# Patient Record
Sex: Female | Born: 1955 | Race: White | Hispanic: No | Marital: Married | Smoking: Former smoker
Health system: Southern US, Community
[De-identification: ages and names within clinical notes are randomized; demographics above are authoritative.]

## PROBLEM LIST (undated history)

## (undated) DIAGNOSIS — E559 Vitamin D deficiency, unspecified: Secondary | ICD-10-CM

## (undated) DIAGNOSIS — G43909 Migraine, unspecified, not intractable, without status migrainosus: Secondary | ICD-10-CM

## (undated) DIAGNOSIS — E079 Disorder of thyroid, unspecified: Secondary | ICD-10-CM

## (undated) DIAGNOSIS — E785 Hyperlipidemia, unspecified: Secondary | ICD-10-CM

## (undated) DIAGNOSIS — F329 Major depressive disorder, single episode, unspecified: Secondary | ICD-10-CM

## (undated) DIAGNOSIS — K219 Gastro-esophageal reflux disease without esophagitis: Secondary | ICD-10-CM

## (undated) DIAGNOSIS — F32A Depression, unspecified: Secondary | ICD-10-CM

## (undated) HISTORY — DX: Depression, unspecified: F32.A

## (undated) HISTORY — DX: Gastro-esophageal reflux disease without esophagitis: K21.9

## (undated) HISTORY — DX: Migraine, unspecified, not intractable, without status migrainosus: G43.909

## (undated) HISTORY — DX: Vitamin D deficiency, unspecified: E55.9

## (undated) HISTORY — DX: Disorder of thyroid, unspecified: E07.9

## (undated) HISTORY — DX: Hyperlipidemia, unspecified: E78.5

## (undated) HISTORY — PX: TONSILLECTOMY: SUR1361

## (undated) HISTORY — DX: Major depressive disorder, single episode, unspecified: F32.9

## (undated) HISTORY — PX: BREAST BIOPSY: SHX20

---

## 2015-02-27 ENCOUNTER — Other Ambulatory Visit: Payer: Self-pay

## 2015-02-27 DIAGNOSIS — Z1231 Encounter for screening mammogram for malignant neoplasm of breast: Secondary | ICD-10-CM

## 2015-03-18 ENCOUNTER — Ambulatory Visit
Admission: RE | Admit: 2015-03-18 | Discharge: 2015-03-18 | Disposition: A | Discharge status: Home / Self Care | Payer: No Typology Code available for payment source | Source: Ambulatory Visit | Attending: Family Medicine | Admitting: Family Medicine

## 2015-03-18 DIAGNOSIS — Z1231 Encounter for screening mammogram for malignant neoplasm of breast: Secondary | ICD-10-CM | POA: Diagnosis not present

## 2015-03-24 ENCOUNTER — Other Ambulatory Visit: Payer: Self-pay | Admitting: Family Medicine

## 2015-03-24 DIAGNOSIS — N6489 Other specified disorders of breast: Secondary | ICD-10-CM

## 2015-03-24 DIAGNOSIS — R928 Other abnormal and inconclusive findings on diagnostic imaging of breast: Secondary | ICD-10-CM

## 2015-04-03 ENCOUNTER — Ambulatory Visit
Admission: RE | Admit: 2015-04-03 | Discharge: 2015-04-03 | Disposition: A | Discharge status: Home / Self Care | Payer: No Typology Code available for payment source | Source: Ambulatory Visit | Attending: Family Medicine | Admitting: Family Medicine

## 2015-04-03 DIAGNOSIS — R928 Other abnormal and inconclusive findings on diagnostic imaging of breast: Secondary | ICD-10-CM

## 2015-04-03 DIAGNOSIS — N6489 Other specified disorders of breast: Secondary | ICD-10-CM

## 2015-04-03 DIAGNOSIS — N63 Unspecified lump in breast: Secondary | ICD-10-CM | POA: Diagnosis present

## 2015-04-08 ENCOUNTER — Telehealth: Payer: Self-pay | Admitting: Family Medicine

## 2015-04-08 NOTE — Telephone Encounter (Signed)
Amy, will you please make sure her breast imaging orders have been done ([facility] episode of [facility] want to make sure imaging center puts in the orders and patient is contacted; thanks

## 2015-04-08 NOTE — Telephone Encounter (Signed)
-----   Message from Kerman PasseyMelinda P Lada, MD sent at 04/03/2015  5:03 PM EDT ----- Regarding: Abnormal mammo Radiology should be ordering f/u breast imaging; make sure this happened

## 2015-04-08 NOTE — Telephone Encounter (Signed)
Dr. Sherie DonLada and I reviewed her chart. Pt. Has had all additional imaging done. They are just waiting on her prior films to arrive so they can do comparison.

## 2015-06-12 ENCOUNTER — Other Ambulatory Visit: Payer: Self-pay

## 2015-06-12 DIAGNOSIS — K219 Gastro-esophageal reflux disease without esophagitis: Secondary | ICD-10-CM | POA: Insufficient documentation

## 2015-06-12 DIAGNOSIS — Z5181 Encounter for therapeutic drug level monitoring: Secondary | ICD-10-CM | POA: Insufficient documentation

## 2015-06-12 DIAGNOSIS — F330 Major depressive disorder, recurrent, mild: Secondary | ICD-10-CM | POA: Insufficient documentation

## 2015-06-12 DIAGNOSIS — E039 Hypothyroidism, unspecified: Secondary | ICD-10-CM | POA: Insufficient documentation

## 2015-06-12 DIAGNOSIS — G43909 Migraine, unspecified, not intractable, without status migrainosus: Secondary | ICD-10-CM | POA: Insufficient documentation

## 2015-06-12 DIAGNOSIS — E785 Hyperlipidemia, unspecified: Secondary | ICD-10-CM | POA: Insufficient documentation

## 2015-06-12 MED ORDER — ATORVASTATIN CALCIUM 20 MG PO TABS: 20.0000 mg | ORAL_TABLET | Freq: Every day | ORAL | Status: DC

## 2015-06-12 NOTE — Telephone Encounter (Signed)
She needs a refill on her atorvastatin.

## 2015-06-12 NOTE — Telephone Encounter (Signed)
Patient notified

## 2015-06-12 NOTE — Telephone Encounter (Signed)
Last labs from Feb reviewed; I have entered labs for fasting chol and sgpt Please ask her to have labs drawn in next 2-3 weeks Refill sent

## 2015-07-16 ENCOUNTER — Other Ambulatory Visit: Payer: Self-pay

## 2015-07-16 MED ORDER — OMEPRAZOLE 20 MG PO CPDR: 20.0000 mg | DELAYED_RELEASE_CAPSULE | Freq: Every day | ORAL | Status: DC | PRN

## 2015-07-16 NOTE — Telephone Encounter (Signed)
Pharmacy is [facility]. 

## 2015-07-22 ENCOUNTER — Telehealth: Payer: Self-pay

## 2015-07-22 NOTE — Telephone Encounter (Signed)
-----   Message from Kerman Passey, MD sent at 07/19/2015 12:11 PM EDT ----- Regarding: Remind patient to come in for fasting labs please Overdue for lipids and sgpt  ----- Message -----    From: SYSTEM    Sent: 07/18/2015  12:06 AM      To: Kerman Passey, MD

## 2015-07-22 NOTE — Telephone Encounter (Signed)
Left detailed message to come in for labs.

## 2015-08-06 ENCOUNTER — Other Ambulatory Visit: Payer: Self-pay

## 2015-08-06 DIAGNOSIS — E785 Hyperlipidemia, unspecified: Secondary | ICD-10-CM

## 2015-08-06 NOTE — Telephone Encounter (Signed)
She is coming in on Monday to get labs, but is out of her Atorvastatin.

## 2015-08-07 MED ORDER — ATORVASTATIN CALCIUM 20 MG PO TABS: 20.0000 mg | ORAL_TABLET | Freq: Every day | ORAL | Status: DC

## 2015-08-07 NOTE — Telephone Encounter (Signed)
She is still overdue for labs; I'll send one more month as she says she is going to come in

## 2015-08-11 ENCOUNTER — Other Ambulatory Visit: Payer: No Typology Code available for payment source

## 2015-08-11 DIAGNOSIS — Z5181 Encounter for therapeutic drug level monitoring: Secondary | ICD-10-CM

## 2015-08-11 DIAGNOSIS — E785 Hyperlipidemia, unspecified: Secondary | ICD-10-CM

## 2015-08-12 ENCOUNTER — Telehealth: Payer: Self-pay | Admitting: Family Medicine

## 2015-08-12 ENCOUNTER — Encounter: Payer: Self-pay | Admitting: Family Medicine

## 2015-08-12 DIAGNOSIS — E785 Hyperlipidemia, unspecified: Secondary | ICD-10-CM

## 2015-08-12 LAB — LIPID PANEL W/O CHOL/HDL RATIO
Cholesterol, Total: 171 mg/dL (ref 100–199)
HDL: 43 mg/dL (ref >39)
LDL Calculated: 99 mg/dL (ref 0–99)
TRIGLYCERIDES: 143 mg/dL (ref 0–149)
VLDL Cholesterol Cal: 29 mg/dL (ref 5–40)

## 2015-08-12 LAB — ALT: ALT: 25 IU/L (ref 0–32)

## 2015-08-12 MED ORDER — ATORVASTATIN CALCIUM 20 MG PO TABS
20.0000 mg | ORAL_TABLET | Freq: Every day | ORAL | Status: DC
Start: 2015-08-12 — End: 2016-02-26

## 2015-08-12 NOTE — Telephone Encounter (Signed)
I talked with patient about labs

## 2016-01-19 ENCOUNTER — Other Ambulatory Visit: Payer: Self-pay | Admitting: Family Medicine

## 2016-01-19 NOTE — Telephone Encounter (Signed)
Called patient and left vm to return out call and schedule a f/u appt for her thyroid and cholesterol.

## 2016-01-19 NOTE — Telephone Encounter (Signed)
Please let Revonda StandardSheree Ahn know that I'd like to see patient for an appointment here in the office for:  Thyroid, cholesterol We don't have any visits with her since we went live on [facility] June 1st Please schedule a visit with me on or after April 10th (that will be six months from last set of labs); [facility], [facility] Fasting?  yes Thank you, Dr. Sherie DonLada

## 2016-01-22 ENCOUNTER — Encounter: Payer: Self-pay | Admitting: Family Medicine

## 2016-01-22 NOTE — Telephone Encounter (Signed)
Left multiple vm, no answer. Sending letter home 01/22/16.

## 2016-02-26 ENCOUNTER — Ambulatory Visit (INDEPENDENT_AMBULATORY_CARE_PROVIDER_SITE_OTHER): Payer: No Typology Code available for payment source | Admitting: Family Medicine

## 2016-02-26 ENCOUNTER — Encounter: Payer: Self-pay | Admitting: Family Medicine

## 2016-02-26 VITALS — BP 126/82 | HR 85 | Temp 98.4°F | Resp 14 | Ht 64.0 in | Wt 173.0 lb

## 2016-02-26 DIAGNOSIS — E039 Hypothyroidism, unspecified: Secondary | ICD-10-CM

## 2016-02-26 DIAGNOSIS — Z124 Encounter for screening for malignant neoplasm of cervix: Secondary | ICD-10-CM | POA: Diagnosis not present

## 2016-02-26 DIAGNOSIS — Z1211 Encounter for screening for malignant neoplasm of colon: Secondary | ICD-10-CM

## 2016-02-26 DIAGNOSIS — E785 Hyperlipidemia, unspecified: Secondary | ICD-10-CM

## 2016-02-26 DIAGNOSIS — R12 Heartburn: Secondary | ICD-10-CM | POA: Diagnosis not present

## 2016-02-26 DIAGNOSIS — Z636 Dependent relative needing care at home: Secondary | ICD-10-CM | POA: Diagnosis not present

## 2016-02-26 DIAGNOSIS — Z0000 Encounter for general adult medical examination without abnormal findings: Secondary | ICD-10-CM | POA: Diagnosis not present

## 2016-02-26 MED ORDER — OMEPRAZOLE 20 MG PO CPDR: 20.0000 mg | DELAYED_RELEASE_CAPSULE | Freq: Every day | ORAL | Status: DC | PRN

## 2016-02-26 MED ORDER — LEVOTHYROXINE SODIUM 88 MCG PO TABS: 88.0000 ug | ORAL_TABLET | Freq: Every day | ORAL | Status: DC

## 2016-02-26 MED ORDER — ATORVASTATIN CALCIUM 20 MG PO TABS
20.0000 mg | ORAL_TABLET | Freq: Every day | ORAL | Status: DC
Start: 2016-02-26 — End: 2017-03-11

## 2016-02-26 NOTE — Patient Instructions (Addendum)
Take 1,000 iu vitamin D daily  Health Maintenance, Female Adopting a healthy lifestyle and getting preventive care can go a long way to promote health and wellness. Talk with your health care provider about what schedule of regular examinations is right for you. This is a good chance for you to check in with your provider about disease prevention and staying healthy. In between checkups, there are plenty of things you can do on your own. Experts have done a lot of research about which lifestyle changes and preventive measures are most likely to keep you healthy. Ask your health care provider for more information. WEIGHT AND DIET  Eat a healthy diet  Be sure to include plenty of vegetables, fruits, low-fat dairy products, and lean protein.  Do not eat a lot of foods high in solid fats, added sugars, or salt.  Get regular exercise. This is one of the most important things you can do for your health.  Most adults should exercise for at least 150 minutes each week. The exercise should increase your heart rate and make you sweat (moderate-intensity exercise).  Most adults should also do strengthening exercises at least twice a week. This is in addition to the moderate-intensity exercise.  Maintain a healthy weight  Body mass index (BMI) is a measurement that can be used to identify possible weight problems. It estimates body fat based on height and weight. Your health care provider can help determine your BMI and help you achieve or maintain a healthy weight.  For females 25 years of age and older:   A BMI below 18.5 is considered underweight.  A BMI of 18.5 to 24.9 is normal.  A BMI of 25 to 29.9 is considered overweight.  A BMI of 30 and above is considered obese.  Watch levels of cholesterol and blood lipids  You should start having your blood tested for lipids and cholesterol at 60 years of age, then have this test every 5 years.  You may need to have your cholesterol levels  checked more often if:  Your lipid or cholesterol levels are high.  You are older than 60 years of age.  You are at high risk for heart disease.  CANCER SCREENING   Lung Cancer  Lung cancer screening is recommended for adults 30-2 years old who are at high risk for lung cancer because of a history of smoking.  A yearly low-dose CT scan of the lungs is recommended for people who:  Currently smoke.  Have quit within the past 15 years.  Have at least a 30-pack-year history of smoking. A pack year is smoking an average of one pack of cigarettes a day for 1 year.  Yearly screening should continue until it has been 15 years since you quit.  Yearly screening should stop if you develop a health problem that would prevent you from having lung cancer treatment.  Breast Cancer  Practice breast self-awareness. This means understanding how your breasts normally appear and feel.  It also means doing regular breast self-exams. Let your health care provider know about any changes, no matter how small.  If you are in your 20s or 30s, you should have a clinical breast exam (CBE) by a health care provider every 1-3 years as part of a regular health exam.  If you are 32 or older, have a CBE every year. Also consider having a breast X-ray (mammogram) every year.  If you have a family history of breast cancer, talk to your health care  provider about genetic screening.  If you are at high risk for breast cancer, talk to your health care provider about having an MRI and a mammogram every year.  Breast cancer gene (BRCA) assessment is recommended for women who have family members with BRCA-related cancers. BRCA-related cancers include:  Breast.  Ovarian.  Tubal.  Peritoneal cancers.  Results of the assessment will determine the need for genetic counseling and BRCA1 and BRCA2 testing. Cervical Cancer Your health care provider may recommend that you be screened regularly for cancer of the  pelvic organs (ovaries, uterus, and vagina). This screening involves a pelvic examination, including checking for microscopic changes to the surface of your cervix (Pap test). You may be encouraged to have this screening done every 3 years, beginning at age 17.  For women ages 42-65, health care providers may recommend pelvic exams and Pap testing every 3 years, or they may recommend the Pap and pelvic exam, combined with testing for human papilloma virus (HPV), every 5 years. Some types of HPV increase your risk of cervical cancer. Testing for HPV may also be done on women of any age with unclear Pap test results.  Other health care providers may not recommend any screening for nonpregnant women who are considered low risk for pelvic cancer and who do not have symptoms. Ask your health care provider if a screening pelvic exam is right for you.  If you have had past treatment for cervical cancer or a condition that could lead to cancer, you need Pap tests and screening for cancer for at least 20 years after your treatment. If Pap tests have been discontinued, your risk factors (such as having a new sexual partner) need to be reassessed to determine if screening should resume. Some women have medical problems that increase the chance of getting cervical cancer. In these cases, your health care provider may recommend more frequent screening and Pap tests. Colorectal Cancer  This type of cancer can be detected and often prevented.  Routine colorectal cancer screening usually begins at 60 years of age and continues through 60 years of age.  Your health care provider may recommend screening at an earlier age if you have risk factors for colon cancer.  Your health care provider may also recommend using home test kits to check for hidden blood in the stool.  A small camera at the end of a tube can be used to examine your colon directly (sigmoidoscopy or colonoscopy). This is done to check for the earliest  forms of colorectal cancer.  Routine screening usually begins at age 10.  Direct examination of the colon should be repeated every 5-10 years through 60 years of age. However, you may need to be screened more often if early forms of precancerous polyps or small growths are found. Skin Cancer  Check your skin from head to toe regularly.  Tell your health care provider about any new moles or changes in moles, especially if there is a change in a mole's shape or color.  Also tell your health care provider if you have a mole that is larger than the size of a pencil eraser.  Always use sunscreen. Apply sunscreen liberally and repeatedly throughout the day.  Protect yourself by wearing long sleeves, pants, a wide-brimmed hat, and sunglasses whenever you are outside. HEART DISEASE, DIABETES, AND HIGH BLOOD PRESSURE   High blood pressure causes heart disease and increases the risk of stroke. High blood pressure is more likely to develop in:  People who  have blood pressure in the high end of the normal range (130-139/85-89 mm Hg).  People who are overweight or obese.  People who are African American.  If you are 21-15 years of age, have your blood pressure checked every 3-5 years. If you are 19 years of age or older, have your blood pressure checked every year. You should have your blood pressure measured twice--once when you are at a hospital or clinic, and once when you are not at a hospital or clinic. Record the average of the two measurements. To check your blood pressure when you are not at a hospital or clinic, you can use:  An automated blood pressure machine at a pharmacy.  A home blood pressure monitor.  If you are between 51 years and 61 years old, ask your health care provider if you should take aspirin to prevent strokes.  Have regular diabetes screenings. This involves taking a blood sample to check your fasting blood sugar level.  If you are at a normal weight and have a low  risk for diabetes, have this test once every three years after 60 years of age.  If you are overweight and have a high risk for diabetes, consider being tested at a younger age or more often. PREVENTING INFECTION  Hepatitis B  If you have a higher risk for hepatitis B, you should be screened for this virus. You are considered at high risk for hepatitis B if:  You were born in a country where hepatitis B is common. Ask your health care provider which countries are considered high risk.  Your parents were born in a high-risk country, and you have not been immunized against hepatitis B (hepatitis B vaccine).  You have HIV or AIDS.  You use needles to inject street drugs.  You live with someone who has hepatitis B.  You have had sex with someone who has hepatitis B.  You get hemodialysis treatment.  You take certain medicines for conditions, including cancer, organ transplantation, and autoimmune conditions. Hepatitis C  Blood testing is recommended for:  Everyone born from [birth year] through 1965.  Anyone with known risk factors for hepatitis C. Sexually transmitted infections (STIs)  You should be screened for sexually transmitted infections (STIs) including gonorrhea and chlamydia if:  You are sexually active and are younger than 60 years of age.  You are older than 60 years of age and your health care provider tells you that you are at risk for this type of infection.  Your sexual activity has changed since you were last screened and you are at an increased risk for chlamydia or gonorrhea. Ask your health care provider if you are at risk.  If you do not have HIV, but are at risk, it may be recommended that you take a prescription medicine daily to prevent HIV infection. This is called pre-exposure prophylaxis (PrEP). You are considered at risk if:  You are sexually active and do not regularly use condoms or know the HIV status of your partner(s).  You take drugs by  injection.  You are sexually active with a partner who has HIV. Talk with your health care provider about whether you are at high risk of being infected with HIV. If you choose to begin PrEP, you should first be tested for HIV. You should then be tested every 3 months for as long as you are taking PrEP.  PREGNANCY   If you are premenopausal and you may become pregnant, ask your health care provider  about preconception counseling.  If you may become pregnant, take 400 to 800 micrograms (mcg) of folic acid every day.  If you want to prevent pregnancy, talk to your health care provider about birth control (contraception). OSTEOPOROSIS AND MENOPAUSE   Osteoporosis is a disease in which the bones lose minerals and strength with aging. This can result in serious bone fractures. Your risk for osteoporosis can be identified using a bone density scan.  If you are 44 years of age or older, or if you are at risk for osteoporosis and fractures, ask your health care provider if you should be screened.  Ask your health care provider whether you should take a calcium or vitamin D supplement to lower your risk for osteoporosis.  Menopause may have certain physical symptoms and risks.  Hormone replacement therapy may reduce some of these symptoms and risks. Talk to your health care provider about whether hormone replacement therapy is right for you.  HOME CARE INSTRUCTIONS   Schedule regular health, dental, and eye exams.  Stay current with your immunizations.   Do not use any tobacco products including cigarettes, chewing tobacco, or electronic cigarettes.  If you are pregnant, do not drink alcohol.  If you are breastfeeding, limit how much and how often you drink alcohol.  Limit alcohol intake to no more than 1 drink per day for nonpregnant women. One drink equals 12 ounces of beer, 5 ounces of wine, or 1 ounces of hard liquor.  Do not use street drugs.  Do not share needles.  Ask your  health care provider for help if you need support or information about quitting drugs.  Tell your health care provider if you often feel depressed.  Tell your health care provider if you have ever been abused or do not feel safe at home.   This information is not intended to replace advice given to you by your health care provider. Make sure you discuss any questions you have with your health care provider.   Document Released: 05/03/2011 Document Revised: 11/08/2014 Document Reviewed: 09/19/2013 Elsevier Interactive Patient Education [employer].

## 2016-02-26 NOTE — Assessment & Plan Note (Signed)
Check lipids 

## 2016-02-26 NOTE — Assessment & Plan Note (Signed)
Check TSH 

## 2016-02-26 NOTE — Progress Notes (Signed)
Patient ID: Dawn Wells, female   DOB: 1956/09/14, 60 y.o.   MRN: [id number]   Subjective:   Dawn Wells is a 60 y.o. 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 Does not feel hopeless; does not feel like hurting herself or anyone else Not sleeping well for 36 years; has too much responsibility in the middle of the night, does not want a sleeping pill Mother-in-law left for [place] in Christmas Husband is dealing with dementia; she does not want a support group or medicine or counseling; she can vent at work  USPSTF grade A and B recommendations Alcohol: n/a Depression: would not do the testing Hypertension: controlled Obesity: not obese Tobacco use: former use; quit at age 18 HIV, hep B, hep C: declined STD testing and prevention (chl/gon/syphilis): asx Lipids: fasting labs another day Glucose: fasting labs another day Colorectal cancer: 2007; patient refuses colonoscopy; willing to do Cologuard Breast cancer: done last year BRCA gene screening: no breast or ovarian cancer Intimate partner violence: no Cervical cancer screening: negative last year Lung cancer: n/a Osteoporosis: start at age 53 Fall prevention/vitamin D: taking vit D 1000 iu BID AAA: n/a Aspirin: taking excedrin daily with 250 mg aspirin Diet: eats pretty well; doesn't eat burgers much; cereal in the morning Exercise: tries to walk every day, not miles, but is active, 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 Thyroid disease    . Hyperlipidemia   . Vitamin D deficiency   . Depression   . Migraine   . GERD (gastroesophageal reflux disease)     Past Surgical History  Procedure Laterality Date  . Cesarean section      X2  . Tonsillectomy     Family History  Problem Relation Age of Onset  . Leukemia Mother   . Hyperlipidemia Mother   . Hypertension Mother   . Heart disease Father   . Hyperlipidemia Father   . Hypertension Father   . Kidney disease Father   . Diabetes Sister   . Heart disease Sister   . Hyperlipidemia Sister   . Hypertension Sister    Social History  Substance Use Topics  . Smoking status: Former Smoker    Quit date: 02/25/1974  . Smokeless tobacco: Never Used  . Alcohol Use: No   Review of Systems  Objective:   Filed Vitals:   02/26/16 1402  BP: 126/82  Pulse: 85  Temp: 98.4 F (36.9 C)  TempSrc: Oral  Resp: 14  Height: '5\' 4"'  (1.626 m)  Weight: 173 lb (78.472 kg)  SpO2: 94%   Body mass index is 29.68 kg/(m^2). Wt Readings from Last 3 Encounters:  02/26/16 173 lb (78.472 kg)   Physical Exam  Constitutional: She appears well-developed and well-nourished.  overweight  HENT:  Head: Normocephalic and atraumatic.  Eyes: Conjunctivae and EOM are normal. Right eye exhibits no hordeolum. Left eye exhibits no hordeolum. No scleral icterus.  Neck: Carotid bruit is not present. No thyromegaly present.  Cardiovascular: Normal rate, regular rhythm, S1  normal, S2 normal and normal heart sounds.   No extrasystoles are present.  Pulmonary/Chest: Effort normal and breath sounds normal. No respiratory distress. Right breast exhibits no inverted nipple, no mass, no nipple discharge, no skin change and no tenderness. Left breast exhibits no inverted nipple, no mass, no nipple discharge, no skin change and no tenderness. Breasts are symmetrical.  Abdominal: Soft. Normal appearance and bowel sounds are normal. She exhibits no distension, no abdominal bruit, no pulsatile midline mass and  no mass. There is no hepatosplenomegaly. There is no tenderness. No hernia.  Genitourinary: Uterus normal. Pelvic exam was performed with patient prone. There is no rash or lesion on the right labia. There is no rash or lesion on the left labia. Cervix exhibits no motion tenderness. Right adnexum displays no mass, no tenderness and no fullness. Left adnexum displays no mass, no tenderness and no fullness.  Musculoskeletal: Normal range of motion. She exhibits no edema.  Lymphadenopathy:       Head (right side): No submandibular adenopathy present.       Head (left side): No submandibular adenopathy present.    She has no cervical adenopathy.    She has no axillary adenopathy.  Neurological: She is alert. She displays no tremor. No cranial nerve deficit. She exhibits normal muscle tone. Gait normal.  Skin: Skin is warm and dry. No bruising and no ecchymosis noted. No cyanosis. No pallor.  Psychiatric: Her speech is normal and behavior is normal. Thought content normal. Her mood appears not anxious. She does not exhibit a depressed mood.   Assessment/Plan:   Problem List Items Addressed This Visit      Endocrine   Hypothyroidism    Check TSH      Relevant Medications   levothyroxine (SYNTHROID, LEVOTHROID) 88 MCG tablet   Other Relevant Orders   TSH (Completed)     Other   Caregiver stress    Supportive listening provided; counseling suggested      Cervical cancer screening    Thin prep collected today      Dyslipidemia    Check lipids      Relevant Medications   atorvastatin (LIPITOR) 20 MG tablet   Preventative health care - Primary    USPSTF grade A and B recommendations reviewed with patient; age-appropriate recommendations, preventive care, screening tests, etc discussed and encouraged; healthy living encouraged; see AVS for patient education given to patient      Relevant Orders   CBC with Differential (Completed)   Comprehensive metabolic panel (Completed)   Lipid  Panel w/o Chol/HDL Ratio (Completed)    Other Visit Diagnoses    Colon cancer screening        patient declines colonoscopy but is open to Cologuard; ordered    Relevant Orders    Cologuard    Heartburn        Relevant Orders    H Pylori, IGM, IGG, IGA AB        Meds ordered this encounter  Medications  . levothyroxine (SYNTHROID, LEVOTHROID) 88 MCG tablet    Sig: Take 1 tablet (88 mcg total) by mouth daily.    Dispense:  30 tablet    Refill:  11  . atorvastatin (LIPITOR) 20 MG tablet    Sig: Take 1 tablet (20 mg total) by mouth at bedtime.    Dispense:  30 tablet    Refill:  6  . omeprazole (PRILOSEC) 20 MG capsule    Sig: Take 1 capsule (20 mg total) by  mouth daily as needed. Caution:prolonged use may increase risk of pneumonia, colitis, osteoporosis, anemia    Dispense:  30 capsule    Refill:  2   Orders Placed This Encounter  Procedures  . Cologuard  . CBC with Differential  . Comprehensive metabolic panel  . Lipid Panel w/o Chol/HDL Ratio  . TSH  . H Pylori, IGM, IGG, IGA AB  . H Pylori, IGM, IGG, IGA AB    Follow up plan: Return in about 1 year (around 02/25/2017) for complete physical. An after-visit summary was printed and given to the patient at [facility].  Please see the patient instructions which may contain other information and recommendations beyond what is mentioned above in the assessment and plan.

## 2016-03-04 ENCOUNTER — Encounter: Payer: Self-pay | Admitting: Family Medicine

## 2016-03-10 ENCOUNTER — Telehealth: Payer: Self-pay

## 2016-03-10 MED ORDER — FLUOXETINE HCL 20 MG PO TABS: 20.0000 mg | ORAL_TABLET | Freq: Every day | ORAL | Status: DC

## 2016-03-10 NOTE — Telephone Encounter (Signed)
That will be fine; Rx sent

## 2016-03-10 NOTE — Telephone Encounter (Signed)
Pt decided wanted to go back on fluoxetine 20mg  1 qd?

## 2016-03-11 LAB — CBC WITH DIFFERENTIAL/PLATELET
Basophils Absolute: 0 10*3/uL (ref 0.0–0.2)
Basos: 0 %
EOS (ABSOLUTE): 0.1 10*3/uL (ref 0.0–0.4)
EOS: 2 %
HEMATOCRIT: 38.2 % (ref 34.0–46.6)
Hemoglobin: 13.3 g/dL (ref 11.1–15.9)
Immature Grans (Abs): 0 10*3/uL (ref 0.0–0.1)
Immature Granulocytes: 0 %
LYMPHS ABS: 2.2 10*3/uL (ref 0.7–3.1)
Lymphs: 36 %
MCH: 31.4 pg (ref 26.6–33.0)
MCHC: 34.8 g/dL (ref 31.5–35.7)
MCV: 90 fL (ref 79–97)
MONOS ABS: 0.5 10*3/uL (ref 0.1–0.9)
Monocytes: 8 %
Neutrophils Absolute: 3.3 10*3/uL (ref 1.4–7.0)
Neutrophils: 54 %
Platelets: 296 10*3/uL (ref 150–379)
RBC: 4.24 x10E6/uL (ref 3.77–5.28)
RDW: 13.3 % (ref 12.3–15.4)
WBC: 6.1 10*3/uL (ref 3.4–10.8)

## 2016-03-11 LAB — COMPREHENSIVE METABOLIC PANEL
A/G RATIO: 1.8 (ref 1.2–2.2)
ALK PHOS: 87 IU/L (ref 39–117)
ALT: 27 IU/L (ref 0–32)
AST: 21 IU/L (ref 0–40)
Albumin: 4.4 g/dL (ref 3.5–5.5)
BILIRUBIN TOTAL: 0.6 mg/dL (ref 0.0–1.2)
BUN/Creatinine Ratio: 19 (ref 9–23)
BUN: 17 mg/dL (ref 6–24)
CHLORIDE: 100 mmol/L (ref 96–106)
CO2: 24 mmol/L (ref 18–29)
CREATININE: 0.91 mg/dL (ref 0.57–1.00)
Calcium: 9.8 mg/dL (ref 8.7–10.2)
GFR calc Af Amer: 80 mL/min/{1.73_m2} (ref >59)
GFR calc non Af Amer: 69 mL/min/{1.73_m2} (ref >59)
GLOBULIN, TOTAL: 2.5 g/dL (ref 1.5–4.5)
Glucose: 98 mg/dL (ref 65–99)
POTASSIUM: 4.7 mmol/L (ref 3.5–5.2)
SODIUM: 140 mmol/L (ref 134–144)
Total Protein: 6.9 g/dL (ref 6.0–8.5)

## 2016-03-11 LAB — LIPID PANEL W/O CHOL/HDL RATIO
CHOLESTEROL TOTAL: 178 mg/dL (ref 100–199)
HDL: 46 mg/dL (ref >39)
LDL Calculated: 95 mg/dL (ref 0–99)
TRIGLYCERIDES: 184 mg/dL — AB (ref 0–149)
VLDL Cholesterol Cal: 37 mg/dL (ref 5–40)

## 2016-03-11 LAB — H PYLORI, IGM, IGG, IGA AB
H pylori, IgM Abs: <9 units (ref 0.0–8.9)
H. pylori, IgA Abs: <9 units (ref 0.0–8.9)

## 2016-03-11 LAB — TSH: TSH: 2.79 u[IU]/mL (ref 0.450–4.500)

## 2016-03-21 ENCOUNTER — Encounter: Payer: Self-pay | Admitting: Family Medicine

## 2016-03-21 DIAGNOSIS — Z636 Dependent relative needing care at home: Secondary | ICD-10-CM | POA: Insufficient documentation

## 2016-03-21 NOTE — Assessment & Plan Note (Signed)
Thin prep collected today 

## 2016-03-21 NOTE — Assessment & Plan Note (Signed)
Supportive listening provided; counseling suggested

## 2016-03-21 NOTE — Assessment & Plan Note (Signed)
USPSTF grade A and B recommendations reviewed with patient; age-appropriate recommendations, preventive care, screening tests, etc discussed and encouraged; healthy living encouraged; see AVS for patient education given to patient  

## 2016-05-14 ENCOUNTER — Other Ambulatory Visit: Payer: Self-pay | Admitting: Family Medicine

## 2016-10-05 ENCOUNTER — Other Ambulatory Visit: Payer: Self-pay | Admitting: Family Medicine

## 2017-02-15 ENCOUNTER — Other Ambulatory Visit: Payer: Self-pay | Admitting: Family Medicine

## 2017-02-15 NOTE — Telephone Encounter (Signed)
Last visit April 2017; visit scheduled for later this month; one month Rx sent

## 2017-02-28 ENCOUNTER — Ambulatory Visit (INDEPENDENT_AMBULATORY_CARE_PROVIDER_SITE_OTHER): Payer: No Typology Code available for payment source | Admitting: Family Medicine

## 2017-02-28 ENCOUNTER — Encounter: Payer: Self-pay | Admitting: Family Medicine

## 2017-02-28 VITALS — BP 124/80 | HR 76 | Temp 98.4°F | Resp 16 | Ht 63.7 in | Wt 169.8 lb

## 2017-02-28 DIAGNOSIS — E039 Hypothyroidism, unspecified: Secondary | ICD-10-CM | POA: Diagnosis not present

## 2017-02-28 DIAGNOSIS — Z636 Dependent relative needing care at home: Secondary | ICD-10-CM | POA: Diagnosis not present

## 2017-02-28 DIAGNOSIS — G43709 Chronic migraine without aura, not intractable, without status migrainosus: Secondary | ICD-10-CM

## 2017-02-28 DIAGNOSIS — E785 Hyperlipidemia, unspecified: Secondary | ICD-10-CM

## 2017-02-28 DIAGNOSIS — R239 Unspecified skin changes: Secondary | ICD-10-CM

## 2017-02-28 DIAGNOSIS — K219 Gastro-esophageal reflux disease without esophagitis: Secondary | ICD-10-CM | POA: Diagnosis not present

## 2017-02-28 DIAGNOSIS — Z5181 Encounter for therapeutic drug level monitoring: Secondary | ICD-10-CM | POA: Diagnosis not present

## 2017-02-28 MED ORDER — OMEPRAZOLE 20 MG PO CPDR: 20.0000 mg | DELAYED_RELEASE_CAPSULE | Freq: Every day | ORAL | 3 refills | Status: DC

## 2017-02-28 NOTE — Assessment & Plan Note (Signed)
Switch from H2 blocker to PPI; avoid triggers, elevate HOB, refer to GI; discussed risk of barretts, esophageal cancer, need for EGD (referral entered)

## 2017-02-28 NOTE — Assessment & Plan Note (Signed)
Suggested [facility], see AVS; continue SSRI

## 2017-02-28 NOTE — Patient Instructions (Addendum)
Limit egg yolks to no more than 3 per week Try magnesium oxide 250 mg daily for headache prevention Let's have you return tomorrow or later this week for fasting labs  [facility] Address: [address] [place], [place], [place] [address]  Phone: [phone]  [email or web address]

## 2017-02-28 NOTE — Assessment & Plan Note (Signed)
Check lipids; limit saturated fats 

## 2017-02-28 NOTE — Progress Notes (Signed)
  Patient ID: Dawn Wells, female   DOB: [birth year], 61 y.o.   MRN: [id number]   Subjective:   Dawn Wells is a 61 y.o. female here for a complete physical exam  Interim issues since last visit:   Past Medical History:  Diagnosis Date  . Depression   . GERD (gastroesophageal reflux disease)   . Hyperlipidemia   . Migraine   . Thyroid disease   . Vitamin D deficiency    Past Surgical History:  Procedure Laterality Date  . CESAREAN SECTION     X2  . TONSILLECTOMY     Family History  Problem Relation Age of Onset  . Leukemia Mother   . Hyperlipidemia Mother   . Hypertension Mother   . Heart disease Father   . Hyperlipidemia Father   . Hypertension Father   . Kidney disease Father   . Diabetes Sister   . Heart disease Sister   . Hyperlipidemia Sister   . Hypertension Sister    Social History  Substance Use Topics  . Smoking status: Former Smoker    Quit date: 02/25/1974  . Smokeless tobacco: Never Used  . Alcohol use No   Review of Systems  Objective:   Vitals:   02/28/17 1111  BP: 124/80  Pulse: 76  Resp: 16  Temp: 98.4 F (36.9 C)  TempSrc: Oral  SpO2: 95%  Weight: 169 lb 12.8 oz (77 kg)  Height: 5' 3.7" (1.618 m)   Body mass index is 29.42 kg/m. Wt Readings from Last 3 Encounters:  02/28/17 169 lb 12.8 oz (77 kg)  02/26/16 173 lb (78.5 kg)   Physical Exam  Assessment/Plan:   Problem List Items Addressed This Visit    None       Meds ordered this encounter  Medications  . aspirin 325 MG tablet    Sig: Take 325 mg by mouth 2 (two) times daily.  . ranitidine (ZANTAC) 150 MG capsule    Sig: Take 75 mg by mouth 2 (two) times daily.   No orders of the defined types were placed in this encounter.   Follow up plan: No Follow-up on file.  An After Visit Summary was printed and given to the patient.

## 2017-02-28 NOTE — Assessment & Plan Note (Signed)
Check labs 

## 2017-02-28 NOTE — Assessment & Plan Note (Signed)
Check TSH and adjust dose if needed 
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
## 2017-02-28 NOTE — Assessment & Plan Note (Signed)
Evaluated already by neurologist; patient controls headaches; encouraged hydration, suggested magnesium oxide 250 mg daily

## 2017-03-02 LAB — CBC WITH DIFFERENTIAL/PLATELET
BASOS ABS: 0 {cells}/uL (ref 0–200)
Basophils Relative: 0 %
EOS ABS: 124 {cells}/uL (ref 15–500)
EOS PCT: 2 %
HCT: 40.5 % (ref 35.0–45.0)
Hemoglobin: 13.5 g/dL (ref 11.7–15.5)
LYMPHS PCT: 34 %
Lymphs Abs: 2108 cells/uL (ref 850–3900)
MCH: 31.1 pg (ref 27.0–33.0)
MCHC: 33.3 g/dL (ref 32.0–36.0)
MCV: 93.3 fL (ref 80.0–100.0)
MONOS PCT: 8 %
MPV: 10.2 fL (ref 7.5–12.5)
Monocytes Absolute: 496 cells/uL (ref 200–950)
NEUTROS PCT: 56 %
Neutro Abs: 3472 cells/uL (ref 1500–7800)
PLATELETS: 289 10*3/uL (ref 140–400)
RBC: 4.34 MIL/uL (ref 3.80–5.10)
RDW: 13.6 % (ref 11.0–15.0)
WBC: 6.2 10*3/uL (ref 3.8–10.8)

## 2017-03-02 LAB — CMP 10231
AG RATIO: 1.4 ratio (ref 1.0–2.5)
ALBUMIN: 4.1 g/dL (ref 3.6–5.1)
ALK PHOS: 84 U/L (ref 33–130)
ALT: 18 U/L (ref 6–29)
AST: 17 U/L (ref 10–35)
BUN/Creatinine Ratio: 17.9 Ratio (ref 6–22)
BUN: 15 mg/dL (ref 7–25)
CHLORIDE: 105 mmol/L (ref 98–110)
CO2: 24 mmol/L (ref 20–31)
Calcium: 9.1 mg/dL (ref 8.6–10.4)
Creat: 0.84 mg/dL (ref 0.50–0.99)
GFR, EST NON AFRICAN AMERICAN: 76 mL/min (ref >=60)
GFR, Est African American: 87 mL/min (ref >=60)
Globulin: 2.9 g/dL (ref 1.9–3.7)
Glucose, Bld: 88 mg/dL (ref 65–99)
POTASSIUM: 4.4 mmol/L (ref 3.5–5.3)
SODIUM: 141 mmol/L (ref 135–146)
TOTAL PROTEIN: 7 g/dL (ref 6.1–8.1)
Total Bilirubin: 0.5 mg/dL (ref 0.2–1.2)

## 2017-03-02 LAB — LIPID PANEL
CHOL/HDL RATIO: 4.2 ratio (ref <5.0)
CHOLESTEROL: 173 mg/dL (ref <200)
HDL: 41 mg/dL — ABNORMAL LOW (ref >50)
LDL Cholesterol: 98 mg/dL (ref <100)
Triglycerides: 172 mg/dL — ABNORMAL HIGH (ref <150)
VLDL: 34 mg/dL — ABNORMAL HIGH (ref <30)

## 2017-03-02 LAB — TSH: TSH: 2.14 mIU/L

## 2017-03-03 LAB — HEMOGLOBIN A1C
Hgb A1c MFr Bld: 5.5 % (ref <5.7)
Mean Plasma Glucose: 111 mg/dL

## 2017-03-11 ENCOUNTER — Other Ambulatory Visit: Payer: Self-pay | Admitting: Family Medicine

## 2017-03-11 DIAGNOSIS — E785 Hyperlipidemia, unspecified: Secondary | ICD-10-CM

## 2017-03-11 NOTE — Telephone Encounter (Signed)
April 2018 SGPT and lipids reviewed; Rx approved

## 2017-03-22 ENCOUNTER — Other Ambulatory Visit: Payer: Self-pay | Admitting: Family Medicine

## 2017-03-22 DIAGNOSIS — E039 Hypothyroidism, unspecified: Secondary | ICD-10-CM

## 2017-03-22 NOTE — Telephone Encounter (Signed)
Last thyroid test reviewed Lab Results  Component Value Date   TSH 2.14 02/28/2017    Rx approved

## 2017-03-27 ENCOUNTER — Other Ambulatory Visit: Payer: Self-pay | Admitting: Family Medicine

## 2017-04-01 ENCOUNTER — Other Ambulatory Visit: Payer: Self-pay | Admitting: Family Medicine

## 2017-04-05 ENCOUNTER — Ambulatory Visit: Payer: PRIVATE HEALTH INSURANCE | Admitting: Gastroenterology

## 2017-09-13 ENCOUNTER — Other Ambulatory Visit: Payer: Self-pay | Admitting: Family Medicine

## 2017-09-13 DIAGNOSIS — K219 Gastro-esophageal reflux disease without esophagitis: Secondary | ICD-10-CM

## 2017-11-02 ENCOUNTER — Encounter: Payer: No Typology Code available for payment source | Admitting: Family Medicine

## 2018-02-10 ENCOUNTER — Other Ambulatory Visit: Payer: Self-pay | Admitting: Family Medicine

## 2018-02-10 DIAGNOSIS — K219 Gastro-esophageal reflux disease without esophagitis: Secondary | ICD-10-CM

## 2018-02-10 NOTE — Telephone Encounter (Signed)
Make once daily prn appt in may

## 2018-02-15 ENCOUNTER — Other Ambulatory Visit: Payer: Self-pay | Admitting: Family Medicine

## 2018-02-15 DIAGNOSIS — E785 Hyperlipidemia, unspecified: Secondary | ICD-10-CM

## 2018-03-06 ENCOUNTER — Encounter: Payer: Self-pay | Admitting: Family Medicine

## 2018-03-06 ENCOUNTER — Ambulatory Visit (INDEPENDENT_AMBULATORY_CARE_PROVIDER_SITE_OTHER): Payer: No Typology Code available for payment source | Admitting: Family Medicine

## 2018-03-06 DIAGNOSIS — K219 Gastro-esophageal reflux disease without esophagitis: Secondary | ICD-10-CM

## 2018-03-06 DIAGNOSIS — G43709 Chronic migraine without aura, not intractable, without status migrainosus: Secondary | ICD-10-CM

## 2018-03-06 DIAGNOSIS — F330 Major depressive disorder, recurrent, mild: Secondary | ICD-10-CM | POA: Diagnosis not present

## 2018-03-06 DIAGNOSIS — Z5181 Encounter for therapeutic drug level monitoring: Secondary | ICD-10-CM | POA: Diagnosis not present

## 2018-03-06 DIAGNOSIS — E785 Hyperlipidemia, unspecified: Secondary | ICD-10-CM

## 2018-03-06 DIAGNOSIS — E039 Hypothyroidism, unspecified: Secondary | ICD-10-CM | POA: Diagnosis not present

## 2018-03-06 LAB — TSH: TSH: 2.1 m[IU]/L (ref 0.40–4.50)

## 2018-03-06 LAB — COMPLETE METABOLIC PANEL WITH GFR
AG Ratio: 1.6 (calc) (ref 1.0–2.5)
ALBUMIN MSPROF: 4.4 g/dL (ref 3.6–5.1)
ALT: 21 U/L (ref 6–29)
AST: 21 U/L (ref 10–35)
Alkaline phosphatase (APISO): 89 U/L (ref 33–130)
BUN / CREAT RATIO: 21 (calc) (ref 6–22)
BUN: 21 mg/dL (ref 7–25)
CO2: 27 mmol/L (ref 20–32)
CREATININE: 1.02 mg/dL — AB (ref 0.50–0.99)
Calcium: 9.4 mg/dL (ref 8.6–10.4)
Chloride: 105 mmol/L (ref 98–110)
GFR, EST AFRICAN AMERICAN: 69 mL/min/{1.73_m2} (ref >=60)
GFR, EST NON AFRICAN AMERICAN: 59 mL/min/{1.73_m2} — AB (ref >=60)
GLUCOSE: 94 mg/dL (ref 65–99)
Globulin: 2.7 g/dL (calc) (ref 1.9–3.7)
Potassium: 4.6 mmol/L (ref 3.5–5.3)
Sodium: 140 mmol/L (ref 135–146)
TOTAL PROTEIN: 7.1 g/dL (ref 6.1–8.1)
Total Bilirubin: 0.7 mg/dL (ref 0.2–1.2)

## 2018-03-06 LAB — LIPID PANEL
CHOL/HDL RATIO: 4.7 (calc) (ref <5.0)
Cholesterol: 186 mg/dL (ref <200)
HDL: 40 mg/dL — ABNORMAL LOW (ref >50)
LDL CHOLESTEROL (CALC): 120 mg/dL — AB
NON-HDL CHOLESTEROL (CALC): 146 mg/dL — AB (ref <130)
Triglycerides: 147 mg/dL (ref <150)

## 2018-03-06 MED ORDER — FLUOXETINE HCL 20 MG PO CAPS: 20.0000 mg | ORAL_CAPSULE | Freq: Every day | ORAL | 11 refills | Status: DC

## 2018-03-06 MED ORDER — SUMATRIPTAN SUCCINATE 100 MG PO TABS: ORAL_TABLET | ORAL | 3 refills | Status: DC

## 2018-03-06 MED ORDER — RANITIDINE HCL 150 MG PO TABS: 150.0000 mg | ORAL_TABLET | Freq: Two times a day (BID) | ORAL | 11 refills | Status: DC

## 2018-03-06 NOTE — Assessment & Plan Note (Signed)
Limit saturated fats; check lipids today 

## 2018-03-06 NOTE — Assessment & Plan Note (Signed)
Check renal function on the daily excedrin; caution given about chronic NSAID use

## 2018-03-06 NOTE — Assessment & Plan Note (Signed)
Try magnesium supplementation

## 2018-03-06 NOTE — Assessment & Plan Note (Signed)
Continue medicine; considered chronic dysthymia

## 2018-03-06 NOTE — Progress Notes (Signed)
Patient ID: Dawn Wells, female   DOB: 16-Jan-1956, 62 y.o.   MRN: [id number]   Subjective:   Dawn Wells is a 62 y.o. female here for f/u  Patient is here for f/u  Hypothyroidism; started around 2009; no surgery, no radiation; found on bloodwork Issues with constipation all her life; under control; went to the [facility] for [facility], 1990; they said it was her diet Has come to terms with her diet; will have candy if she wants it and accepts it No hair loss or skin changes Energy level is where she needs to be; highest energy in the morning; good routine Lab Results  Component Value Date   TSH 2.14 02/28/2017   High cholesterol; on statin; bacon once a week; no hot dogs or sausage; some cheese every week  GERD; aware of triggers; taking the PPI and aware of risks; she would rather not have heartburn; taking every other day; no blood in the stool; she never saw the GI for the EGD  Migraines; under control; does not let them become migraines she says; takes excedrin twice a day  Depression; "my attitude really sucks"; doesn't tell everyone; taking SSRI; has hobbies, makes jewelry  Past Medical History:  Diagnosis Date  . Depression   . GERD (gastroesophageal reflux disease)   . Hyperlipidemia   . Migraine   . Thyroid disease   . Vitamin D deficiency    Past Surgical History:  Procedure Laterality Date  . CESAREAN SECTION     X2  . TONSILLECTOMY     Family History  Problem Relation Age of Onset  . Leukemia Mother   . Hyperlipidemia Mother   . Hypertension Mother   . Heart disease Father   . Hyperlipidemia Father   . Hypertension Father   . Kidney disease Father   . Diabetes Sister   . Heart disease Sister   . Hyperlipidemia Sister   . Hypertension Sister    Social History   Tobacco Use  . Smoking status: Former Smoker    Last attempt to quit: 02/25/1974    Years since quitting: 44.0  . Smokeless tobacco: Never Used  Substance Use Topics  . Alcohol  use: No    Alcohol/week: 0.0 oz  . Drug use: No   Review of Systems  Respiratory: Negative for shortness of breath.   Cardiovascular: Negative for chest pain.    Objective:   Vitals:   03/06/18 0821  BP: 124/76  Pulse: 82  Resp: 14  Temp: 98.1 F (36.7 C)  TempSrc: Oral  SpO2: 96%  Weight: 171 lb (77.6 kg)  Height:  (1.626 m)   Body mass index is 29.35 kg/m. Wt Readings from Last 3 Encounters:  03/06/18 171 lb (77.6 kg)  02/28/17 169 lb 12.8 oz (77 kg)  02/26/16 173 lb (78.5 kg)   Physical Exam  Constitutional: She appears well-developed and well-nourished. No distress.  HENT:  Head: Normocephalic and atraumatic.  Eyes: EOM are normal. No scleral icterus.  Neck: No thyromegaly present.  Cardiovascular: Normal rate, regular rhythm and normal heart sounds.  No murmur heard. Pulmonary/Chest: Effort normal and breath sounds normal. No respiratory distress. She has no wheezes.  Abdominal: Soft. She exhibits no distension. There is no tenderness.  Musculoskeletal: She exhibits no edema.  Neurological: She is alert. She exhibits normal muscle tone.  Skin: Skin is warm and dry. She is not diaphoretic. No pallor.  Psychiatric: She has a normal mood and affect. Her behavior is normal.  Judgment and thought content normal.    Assessment/Plan:   Problem List Items Addressed This Visit      Cardiovascular and Mediastinum   Headache, migraine (Chronic)    Try magnesium supplementation      Relevant Medications   aspirin-acetaminophen-caffeine (EXCEDRIN MIGRAINE) 250-250-65 MG tablet   FLUoxetine (PROZAC) 20 MG capsule   SUMAtriptan (IMITREX) 100 MG tablet     Digestive   Acid reflux (Chronic)    Patient aware of risks of PPI; try H2 blocker instead; see AVS      Relevant Medications   ranitidine (ZANTAC) 150 MG tablet     Endocrine   Hypothyroidism (Chronic)    Check TSH today and adjust medicine      Relevant Orders   TSH     Other   Medication  monitoring encounter (Chronic)    Check renal function on the daily excedrin; caution given about chronic NSAID use      Relevant Orders   COMPLETE METABOLIC PANEL WITH GFR   Dyslipidemia (Chronic)    Limit saturated fats; check lipids today      Relevant Orders   Lipid panel   Depression, major, recurrent, mild (HCC)    Continue medicine; considered chronic dysthymia      Relevant Medications   FLUoxetine (PROZAC) 20 MG capsule       Meds ordered this encounter  Medications  . ranitidine (ZANTAC) 150 MG tablet    Sig: Take 1 tablet (150 mg total) by mouth 2 (two) times daily.    Dispense:  60 tablet    Refill:  11  . FLUoxetine (PROZAC) 20 MG capsule    Sig: Take 1 capsule (20 mg total) by mouth daily.    Dispense:  30 capsule    Refill:  11  . SUMAtriptan (IMITREX) 100 MG tablet    Sig: take 1/2 to 1 tablet by mouth immediately and may repeat after 2 hours MAX DAILY DOSE OF 2.    Dispense:  9 tablet    Refill:  3   Orders Placed This Encounter  Procedures  . COMPLETE METABOLIC PANEL WITH GFR  . Lipid panel  . TSH    Follow up plan: Return in about 1 year (around 03/07/2019) for follow-up visit with Dr. Sherie Don.  An After Visit Summary was printed and given to the patient.

## 2018-03-06 NOTE — Assessment & Plan Note (Addendum)
Patient aware of risks of PPI; try H2 blocker instead; see AVS

## 2018-03-06 NOTE — Assessment & Plan Note (Signed)
Check TSH today and adjust medicine

## 2018-03-06 NOTE — Patient Instructions (Addendum)
Caution: prolonged use of proton pump inhibitors like omeprazole (Prilosec), pantoprazole (Protonix), esomeprazole (Nexium), and others like Dexilant and Aciphex may increase your risk of pneumonia, Clostridium difficile colitis, osteoporosis, anemia and other health complications Try to limit or avoid triggers like coffee, caffeinated beverages, onions, chocolate, spicy foods, peppermint, acidic foods like pizza, spaghetti sauce, and orange juice Lose weight if you are overweight or obese Try elevating the head of your bed by placing a small wedge between your mattress and box springs to keep acid in the stomach at night instead of coming up into your esophagus Try the ranitidine instead of the omeprazole Try magnesium oxide 250 mg or 400 mg daily for migraine prophylaxis Consider vitamin D3 supplementation 1000 iu daily We'll get the lab results If you have not heard anything from my staff in a week about any orders/referrals/studies from today, please contact us here to follow-up [phone] Return for a physical at your leisure Return yearly for problem-based visit

## 2018-03-07 ENCOUNTER — Other Ambulatory Visit: Payer: Self-pay | Admitting: Family Medicine

## 2018-03-07 DIAGNOSIS — E785 Hyperlipidemia, unspecified: Secondary | ICD-10-CM

## 2018-03-07 DIAGNOSIS — E039 Hypothyroidism, unspecified: Secondary | ICD-10-CM

## 2018-03-07 MED ORDER — LEVOTHYROXINE SODIUM 88 MCG PO TABS: 88.0000 ug | ORAL_TABLET | Freq: Every day | ORAL | 11 refills | Status: DC

## 2018-03-07 MED ORDER — ATORVASTATIN CALCIUM 20 MG PO TABS: 20.0000 mg | ORAL_TABLET | Freq: Every day | ORAL | 11 refills | Status: DC

## 2018-03-07 NOTE — Progress Notes (Signed)
Refilled meds

## 2018-04-11 ENCOUNTER — Other Ambulatory Visit: Payer: Self-pay | Admitting: Family Medicine

## 2018-04-12 NOTE — Telephone Encounter (Signed)
Rx for fluoxetine not due yet; please resolve with [facility]; thank you

## 2018-04-26 ENCOUNTER — Other Ambulatory Visit: Payer: Self-pay | Admitting: Family Medicine

## 2018-04-26 DIAGNOSIS — K219 Gastro-esophageal reflux disease without esophagitis: Secondary | ICD-10-CM

## 2019-03-07 ENCOUNTER — Ambulatory Visit (INDEPENDENT_AMBULATORY_CARE_PROVIDER_SITE_OTHER): Payer: No Typology Code available for payment source | Admitting: Nurse Practitioner

## 2019-03-07 ENCOUNTER — Encounter: Payer: Self-pay | Admitting: Nurse Practitioner

## 2019-03-07 VITALS — Ht 64.0 in | Wt 174.6 lb

## 2019-03-07 DIAGNOSIS — E039 Hypothyroidism, unspecified: Secondary | ICD-10-CM | POA: Diagnosis not present

## 2019-03-07 DIAGNOSIS — G43709 Chronic migraine without aura, not intractable, without status migrainosus: Secondary | ICD-10-CM

## 2019-03-07 DIAGNOSIS — N182 Chronic kidney disease, stage 2 (mild): Secondary | ICD-10-CM

## 2019-03-07 DIAGNOSIS — F330 Major depressive disorder, recurrent, mild: Secondary | ICD-10-CM

## 2019-03-07 DIAGNOSIS — E785 Hyperlipidemia, unspecified: Secondary | ICD-10-CM | POA: Diagnosis not present

## 2019-03-07 DIAGNOSIS — Z5181 Encounter for therapeutic drug level monitoring: Secondary | ICD-10-CM

## 2019-03-07 MED ORDER — LEVOTHYROXINE SODIUM 88 MCG PO TABS: 88.0000 ug | ORAL_TABLET | Freq: Every day | ORAL | 3 refills | Status: DC

## 2019-03-07 MED ORDER — ATORVASTATIN CALCIUM 20 MG PO TABS: 20.0000 mg | ORAL_TABLET | Freq: Every day | ORAL | 3 refills | Status: DC

## 2019-03-07 MED ORDER — SUMATRIPTAN SUCCINATE 100 MG PO TABS: ORAL_TABLET | ORAL | 5 refills | Status: DC

## 2019-03-07 MED ORDER — FLUOXETINE HCL 20 MG PO CAPS: 20.0000 mg | ORAL_CAPSULE | Freq: Every day | ORAL | 3 refills | Status: DC

## 2019-03-07 NOTE — Progress Notes (Signed)
Virtual Visit via Video Note  I connected with Dawn Wells on 03/07/19 at  3:00 PM EDT by a video enabled telemedicine application and verified that I am speaking with the correct person using two identifiers.   Staff discussed the limitations of evaluation and management by telemedicine and the availability of in person appointments. The patient expressed understanding and agreed to proceed.  Patient location: home  My location: work office Other people present: none, husband in room not participatory  HPI  Hypothyroidism rx to synthroid daily, no missed doses; states has been on this stable dose for years Denies fatigue, dry brittle nails  Lab Results  Component Value Date   TSH 2.10 03/06/2018   GERD rx for ranitidine 150mg  daily Trigger foods   Migraines rx for imitrex 100mg  PRN. Patient endorses migraines a 2 days month or so, states it has improved a lot since the past.   Hyperlipidemia rx atorvastatin Lab Results  Component Value Date   CHOL 186 03/06/2018   HDL 40 (L) 03/06/2018   LDLCALC 120 (H) 03/06/2018   TRIG 147 03/06/2018   CHOLHDL 4.7 03/06/2018    Depression rx for prozac 20mg  daily, this dose works well for her.   PHQ2/9: Depression screen [facility] 2/9 03/06/2018 02/28/2017 02/26/2016  Decreased Interest 0 0 -  Down, Depressed, Hopeless 0 0 (No Data)  PHQ - 2 Score 0 0 -     PHQ reviewed. Negative  Patient Active Problem List   Diagnosis Date Noted  . Caregiver stress 03/21/2016  . Cervical cancer screening 02/26/2016  . Preventative health care 02/26/2016  . Dyslipidemia 06/12/2015  . Medication monitoring encounter 06/12/2015  . Depression, major, recurrent, mild (HCC) 06/12/2015  . Acid reflux 06/12/2015  . Headache, migraine 06/12/2015  . Hypothyroidism 06/12/2015    Past Medical History:  Diagnosis Date  . Depression   . GERD (gastroesophageal reflux disease)   . Hyperlipidemia   . Migraine   . Thyroid disease   . Vitamin D  deficiency     Past Surgical History:  Procedure Laterality Date  . CESAREAN SECTION     X2  . TONSILLECTOMY      Social History   Tobacco Use  . Smoking status: Former Smoker    Last attempt to quit: 02/25/1974    Years since quitting: 45.0  . Smokeless tobacco: Never Used  Substance Use Topics  . Alcohol use: No    Alcohol/week: 0.0 Wells drinks     Current Outpatient Medications:  .  aspirin-acetaminophen-caffeine (EXCEDRIN MIGRAINE) 250-250-65 MG tablet, Take 1 tablet by mouth 2 (two) times daily as needed for headache., Disp: , Rfl:  .  atorvastatin (LIPITOR) 20 MG tablet, Take 1 tablet (20 mg total) by mouth at bedtime., Disp: 30 tablet, Rfl: 11 .  FLUoxetine (PROZAC) 20 MG capsule, Take 1 capsule (20 mg total) by mouth daily., Disp: 30 capsule, Rfl: 11 .  levothyroxine (SYNTHROID, LEVOTHROID) 88 MCG tablet, Take 1 tablet (88 mcg total) by mouth daily., Disp: 30 tablet, Rfl: 11 .  ranitidine (ZANTAC) 150 MG tablet, Take 1 tablet (150 mg total) by mouth 2 (two) times daily., Disp: 60 tablet, Rfl: 11 .  SUMAtriptan (IMITREX) 100 MG tablet, take 1/2 to 1 tablet by mouth immediately and may repeat after 2 hours MAX DAILY DOSE OF 2., Disp: 9 tablet, Rfl: 3  No Known Allergies  ROS    No other specific complaints in a complete review of systems (except as listed in HPI  above).  Objective  Vitals:   03/07/19 1418  Weight: 174 lb 9.6 oz (79.2 kg)  Height: 5\' 4"  (1.626 m)    Body mass index is 29.97 kg/m.  Nursing Note and Vital Signs reviewed.  Physical Exam   Constitutional: Patient appears well-developed and well-nourished. No distress.  HENT: Head: Normocephalic and atraumatic. Pulmonary/Chest: Effort normal  Musculoskeletal: Normal range of motion,  Neurological:  is alert and oriented . speechSkin: No rash noted. No erythema.  Psychiatric: Patient has a normal mood and affect. behavior is normal. Judgment and thought content normal.    Assessment &  Plan  1. Hypothyroidism - levothyroxine (SYNTHROID) 88 MCG tablet; Take 1 tablet (88 mcg total) by mouth daily.  Dispense: 90 tablet; Refill: 3 - TSH  2. Dyslipidemia - atorvastatin (LIPITOR) 20 MG tablet; Take 1 tablet (20 mg total) by mouth at bedtime.  Dispense: 90 tablet; Refill: 3 - Lipid Profile  3. Chronic migraine without aura without status migrainosus, not intractable improving  - SUMAtriptan (IMITREX) 100 MG tablet; take 1/2 to 1 tablet by mouth immediately and may repeat after 2 hours MAX DAILY DOSE OF 2.  Dispense: 9 tablet; Refill: 5  4. Depression, major, recurrent, mild (HCC) stable - FLUoxetine (PROZAC) 20 MG capsule; Take 1 capsule (20 mg total) by mouth daily.  Dispense: 90 capsule; Refill: 3  5. Stage 2 chronic kidney disease Hydration, avoid NSAIDs - COMPLETE METABOLIC PANEL WITH GFR  6. Medication monitoring encounter - COMPLETE METABOLIC PANEL WITH GFR     Follow Up Instructions:  labs in 2-3 months depending on pandemic  Follow up in one year   I discussed the assessment and treatment plan with the patient. The patient was provided an opportunity to ask questions and all were answered. The patient agreed with the plan and demonstrated an understanding of the instructions.   The patient was advised to call back or seek an in-person evaluation if the symptoms worsen or if the condition fails to improve as anticipated.  I provided 22 minutes of non-face-to-face time during this encounter, 15 minutes of video additional time in chart and coordination of care.   Cheryle HorsfallElizabeth E Hassie Mandt, NP

## 2019-03-08 ENCOUNTER — Encounter: Payer: No Typology Code available for payment source | Admitting: Family Medicine

## 2019-03-13 ENCOUNTER — Other Ambulatory Visit: Payer: Self-pay | Admitting: Family Medicine

## 2019-03-13 DIAGNOSIS — E785 Hyperlipidemia, unspecified: Secondary | ICD-10-CM

## 2019-04-05 ENCOUNTER — Other Ambulatory Visit: Payer: Self-pay | Admitting: Family Medicine

## 2019-04-05 DIAGNOSIS — E039 Hypothyroidism, unspecified: Secondary | ICD-10-CM

## 2019-04-05 DIAGNOSIS — F330 Major depressive disorder, recurrent, mild: Secondary | ICD-10-CM

## 2020-03-05 NOTE — Progress Notes (Signed)
Patient ID: Dawn Wells, female    DOB: [birth year], 64 y.o.   MRN: [id number]  PCP: Kerman Passey, MD  Chief Complaint  Patient presents with  . Hypothyroidism    follow up, medication refills  . Hyperlipidemia  . Depression    Subjective:   Dawn Wells is a 64 y.o. female, presents to clinic with CC of the following:  Chief Complaint  Patient presents with  . Hypothyroidism    follow up, medication refills  . Hyperlipidemia  . Depression    HPI:  Patient is a 64 year old female, whose last follow-up at [facility] was in May 2020 via a telemedicine visit She returns today for follow-up, and desires blood work.  Past medical issues that have been followed and were reviewed today include: Hypothyroidism  Hypothyroidism; started around 2009; no surgery, no radiation; found on bloodwork Med -  synthroid daily, no missed doses; states has been on this stable dose for years Denies fatigue, dry brittle nails, increased fatigue Lab Results  Component Value Date   TSH 2.10 03/06/2018   CKD -it was noted on her most recent labs that her kidney function had declined some. GFR was 59. Lab Results  Component Value Date   CREATININE 1.02 (H) 03/06/2018    GERD Omeprazole prn, OTC, uses about 2X/week, and is helpful. knows is diet related, now avoids soda, cut back on sweets, pizza   Migraines rx for imitrex 100mg  PRN - Takes half when uses. Patient endorses migraines at 2 days/ month, states it has improved a lot since the past.  Often awakens with HA and takes Excedrin product just about daily and very helpful. Rarely takes 2/day. Had vigorous w/u with neurology in [place] in past, not anxious to have any further work-up presently  Hyperlipidemia rx atorvastatin - 20mg  Denies muscle aches Tries to watch diet Walks dogs twice a day, > a mile/day Lab Results  Component Value Date   CHOL 186 03/06/2018   HDL 40 (L) 03/06/2018   LDLCALC 120 (H) 03/06/2018   TRIG 147 03/06/2018   CHOLHDL 4.7 03/06/2018    Depression rx for prozac 20mg  daily, this dose works well for her.   She inquired about having a mammogram.  Her last was in 2015.  She also has been seeing a provider here previously for periodic pelvic exams and Pap smears.  Patient Active Problem List   Diagnosis Date Noted  . Caregiver stress 03/21/2016  . Cervical cancer screening 02/26/2016  . Preventative health care 02/26/2016  . Dyslipidemia 06/12/2015  . Medication monitoring encounter 06/12/2015  . Depression, major, recurrent, mild (HCC) 06/12/2015  . Acid reflux 06/12/2015  . Headache, migraine 06/12/2015  . Hypothyroidism 06/12/2015      Current Outpatient Medications:  .  aspirin-acetaminophen-caffeine (EXCEDRIN MIGRAINE) 250-250-65 MG tablet, Take 1 tablet by mouth 2 (two) times daily as needed for headache., Disp: , Rfl:  .  atorvastatin (LIPITOR) 20 MG tablet, Take 1 tablet (20 mg total) by mouth at bedtime., Disp: 90 tablet, Rfl: 3 .  FLUoxetine (PROZAC) 20 MG capsule, Take 1 capsule (20 mg total) by mouth daily., Disp: 90 capsule, Rfl: 3 .  levothyroxine (SYNTHROID) 88 MCG tablet, Take 1 tablet (88 mcg total) by mouth daily., Disp: 90 tablet, Rfl: 3 .  SUMAtriptan (IMITREX) 100 MG tablet, take 1/2 to 1 tablet by mouth immediately and may repeat after 2 hours MAX DAILY DOSE OF 2., Disp: 9 tablet, Rfl: 5   No Known Allergies  Past Surgical History:  Procedure Laterality Date  . CESAREAN SECTION     X2  . TONSILLECTOMY       Family History  Problem Relation Age of Onset  . Leukemia Mother   . Hyperlipidemia Mother   . Hypertension Mother   . Heart disease Father   . Hyperlipidemia Father   . Hypertension Father   . Kidney disease Father   . Diabetes Sister   . Heart disease Sister   . Hyperlipidemia Sister   . Hypertension Sister      Social History   Tobacco Use  . Smoking status: Former Smoker    Quit date: 02/25/1974    Years since  quitting: 46.0  . Smokeless tobacco: Never Used  Substance Use Topics  . Alcohol use: No    Alcohol/week: 0.0 Wells drinks    With staff assistance, above reviewed with the patient today.  ROS: As per HPI, she had new prescription glasses prescribed last fall, Otherwise no specific complaints on a limited and focused system review   No results found for this or any previous visit (from the past 72 hour(s)).   PHQ2/9: Depression screen [facility] 2/9 03/06/2020 03/07/2019 03/06/2018 02/28/2017 02/26/2016  Decreased Interest 0 0 0 0 -  Down, Depressed, Hopeless 0 0 0 0 (No Data)  PHQ - 2 Score 0 0 0 0 -  Altered sleeping 0 - - - -  Tired, decreased energy 0 - - - -  Change in appetite 0 - - - -  Feeling bad or failure about yourself  0 - - - -  Trouble concentrating 0 - - - -  Moving slowly or fidgety/restless 0 - - - -  Suicidal thoughts 0 - - - -  PHQ-9 Score 0 - - - -  Difficult doing work/chores Not difficult at all - - - -   PHQ-2/9 Result is   Fall Risk: Fall Risk  03/06/2020 03/07/2019 03/06/2018 02/28/2017 02/26/2016  Falls in the past year? 0 0 No No No  Number falls in past yr: 0 0 - - -  Injury with Fall? 0 0 - - -  Follow up Falls evaluation completed - - - -      Objective:   Vitals:   03/06/20 1001  BP: 128/72  Pulse: 78  Resp: 14  Temp: 97.8 F (36.6 C)  TempSrc: Temporal  SpO2: 98%  Weight: 171 lb 9.6 oz (77.8 kg)  Height: 5\' 4"  (1.626 m)    Body mass index is 29.46 kg/m.  Physical Exam   NAD, masked, pleasant HEENT - [place]/AT, sclera anicteric, positive glasses, PERRL, EOMI, conj - non-inj'ed, TM's and canals clear, pharynx clear Neck - supple, no adenopathy, no TM, carotids 2+ and = without bruits bilat Car - RRR without m/g/r Pulm- RR and effort normal at rest, CTA without wheeze or rales Abd - soft, NT, ND, BS+,  no masses, no HSM Back - no CVA tenderness Skin- no rash noted on exposed areas,  Ext - no LE edema, no active joints Neuro/psychiatric - affect  was not flat, appropriate with conversation  Alert and oriented  Grossly non-focal - good strength on testing extremities, sensation intact to LT in distal extremities, DTRs 2+ and equal in the patella Romberg was negative, no pronator drift, good finger-to-nose, good balance on 1 foot, gait was normal with good tandem walk  Speech normal   Results for orders placed or performed in visit on 03/06/18  COMPLETE METABOLIC PANEL  WITH GFR  Result Value Ref Range   Glucose, Bld 94 65 - 99 mg/dL   BUN 21 7 - 25 mg/dL   Creat 1.61 (H) 0.96 - 0.99 mg/dL   GFR, Est Non African American 59 (L) > OR = 60 mL/min/1.75m2   GFR, Est African American 69 > OR = 60 mL/min/1.77m2   BUN/Creatinine Ratio 21 6 - 22 (calc)   Sodium 140 135 - 146 mmol/L   Potassium 4.6 3.5 - 5.3 mmol/L   Chloride 105 98 - 110 mmol/L   CO2 27 20 - 32 mmol/L   Calcium 9.4 8.6 - 10.4 mg/dL   Total Protein 7.1 6.1 - 8.1 g/dL   Albumin 4.4 3.6 - 5.1 g/dL   Globulin 2.7 1.9 - 3.7 g/dL (calc)   AG Ratio 1.6 1.0 - 2.5 (calc)   Total Bilirubin 0.7 0.2 - 1.2 mg/dL   Alkaline phosphatase (APISO) 89 33 - 130 U/L   AST 21 10 - 35 U/L   ALT 21 6 - 29 U/L  Lipid panel  Result Value Ref Range   Cholesterol 186 <200 mg/dL   HDL 40 (L) >04 mg/dL   Triglycerides 540 <981 mg/dL   LDL Cholesterol (Calc) 120 (H) mg/dL (calc)   Total CHOL/HDL Ratio 4.7 <5.0 (calc)   Non-HDL Cholesterol (Calc) 146 (H) <130 mg/dL (calc)  TSH  Result Value Ref Range   TSH 2.10 0.40 - 4.50 mIU/L       Assessment & Plan:   1. Hypothyroidism, unspecified type Has been stable on her current Synthroid dose. Does need to have a TSH checked on today. - COMPLETE METABOLIC PANEL WITH GFR - TSH  2. Depression, major, recurrent, mild (HCC) Doing well on the Prozac, will continue. PHQ-9 today reviewed  3. Chronic migraine without aura without status migrainosus, not intractable Does well with management of her headaches, using an Excedrin type product almost  daily, and the Imitrex products approximately 2 times a month noted. Had a vigorous work-up prior in [place], and not anxious for more work-up presently.  4. Gastroesophageal reflux disease without esophagitis Has lessened the omeprazole use to a couple times a week, and did note the potential concerns of regular use of a PPI long-term. She is aware of dietary exacerbations, and trying to avoid more in the recent past. Continue to use the omeprazole as needed like she is doing, with the goal of trying to lessen the issues further over time and better reflux precautions. She does use a Tums product otherwise, we will continue that as well. - CBC with Differential/Platelet  5. Mixed hyperlipidemia Continue the statin We will recheck the lipid panel today She noted she also tries to watch her limiting fats in her diet to help. - COMPLETE METABOLIC PANEL WITH GFR - Lipid panel  6. Stage 3a chronic kidney disease Noted the subtle decrease on last check, and will recheck again today. - COMPLETE METABOLIC PANEL WITH GFR - CBC with Differential/Platelet  7. Encounter for screening mammogram for breast cancer Ordered a mammogram as do feel that is appropriate. - MM 3D SCREEN BREAST BILATERAL; Future Also, will have her see one of my female colleagues for a woman's health annual exam as I noted I do have my female colleagues here help me with those evaluation presently.  She is not followed by an outside gynecologist.  Await lab results, and she noted she follows up on an annual basis, and that we will continue, unless concerns arise from  the lab test for a sooner follow-up.   Jamelle Haring, MD 03/06/20 10:13 AM

## 2020-03-06 ENCOUNTER — Encounter: Payer: Self-pay | Admitting: Internal Medicine

## 2020-03-06 ENCOUNTER — Ambulatory Visit: Payer: No Typology Code available for payment source | Admitting: Internal Medicine

## 2020-03-06 ENCOUNTER — Other Ambulatory Visit: Payer: Self-pay

## 2020-03-06 VITALS — BP 128/72 | HR 78 | Temp 97.8°F | Resp 14 | Ht 64.0 in | Wt 171.6 lb

## 2020-03-06 DIAGNOSIS — F330 Major depressive disorder, recurrent, mild: Secondary | ICD-10-CM | POA: Diagnosis not present

## 2020-03-06 DIAGNOSIS — G43709 Chronic migraine without aura, not intractable, without status migrainosus: Secondary | ICD-10-CM | POA: Diagnosis not present

## 2020-03-06 DIAGNOSIS — E039 Hypothyroidism, unspecified: Secondary | ICD-10-CM | POA: Diagnosis not present

## 2020-03-06 DIAGNOSIS — E782 Mixed hyperlipidemia: Secondary | ICD-10-CM | POA: Insufficient documentation

## 2020-03-06 DIAGNOSIS — N1831 Chronic kidney disease, stage 3a: Secondary | ICD-10-CM

## 2020-03-06 DIAGNOSIS — K219 Gastro-esophageal reflux disease without esophagitis: Secondary | ICD-10-CM | POA: Diagnosis not present

## 2020-03-06 DIAGNOSIS — Z1231 Encounter for screening mammogram for malignant neoplasm of breast: Secondary | ICD-10-CM

## 2020-03-06 HISTORY — DX: Chronic kidney disease, stage 3a: N18.31

## 2020-03-06 LAB — CBC WITH DIFFERENTIAL/PLATELET
Absolute Monocytes: 566 cells/uL (ref 200–950)
Basophils Absolute: 52 cells/uL (ref 0–200)
Basophils Relative: 0.8 %
Eosinophils Absolute: 117 cells/uL (ref 15–500)
Eosinophils Relative: 1.8 %
HCT: 40.1 % (ref 35.0–45.0)
Hemoglobin: 13.5 g/dL (ref 11.7–15.5)
Lymphs Abs: 2022 cells/uL (ref 850–3900)
MCH: 31 pg (ref 27.0–33.0)
MCHC: 33.7 g/dL (ref 32.0–36.0)
MCV: 92 fL (ref 80.0–100.0)
MPV: 10.6 fL (ref 7.5–12.5)
Monocytes Relative: 8.7 %
Neutro Abs: 3744 cells/uL (ref 1500–7800)
Neutrophils Relative %: 57.6 %
Platelets: 274 10*3/uL (ref 140–400)
RBC: 4.36 10*6/uL (ref 3.80–5.10)
RDW: 12.7 % (ref 11.0–15.0)
Total Lymphocyte: 31.1 %
WBC: 6.5 10*3/uL (ref 3.8–10.8)

## 2020-03-06 LAB — LIPID PANEL
Cholesterol: 168 mg/dL (ref <200)
HDL: 40 mg/dL — ABNORMAL LOW (ref >=50)
LDL Cholesterol (Calc): 97 mg/dL (calc)
Non-HDL Cholesterol (Calc): 128 mg/dL (calc) (ref <130)
Total CHOL/HDL Ratio: 4.2 (calc) (ref <5.0)
Triglycerides: 222 mg/dL — ABNORMAL HIGH (ref <150)

## 2020-03-06 LAB — COMPLETE METABOLIC PANEL WITH GFR
AG Ratio: 1.9 (calc) (ref 1.0–2.5)
ALT: 19 U/L (ref 6–29)
AST: 17 U/L (ref 10–35)
Albumin: 4.6 g/dL (ref 3.6–5.1)
Alkaline phosphatase (APISO): 95 U/L (ref 37–153)
BUN: 16 mg/dL (ref 7–25)
CO2: 28 mmol/L (ref 20–32)
Calcium: 10.1 mg/dL (ref 8.6–10.4)
Chloride: 102 mmol/L (ref 98–110)
Creat: 0.88 mg/dL (ref 0.50–0.99)
GFR, Est African American: 81 mL/min/{1.73_m2} (ref >=60)
GFR, Est Non African American: 70 mL/min/{1.73_m2} (ref >=60)
Globulin: 2.4 g/dL (calc) (ref 1.9–3.7)
Glucose, Bld: 92 mg/dL (ref 65–139)
Potassium: 4.4 mmol/L (ref 3.5–5.3)
Sodium: 139 mmol/L (ref 135–146)
Total Bilirubin: 0.7 mg/dL (ref 0.2–1.2)
Total Protein: 7 g/dL (ref 6.1–8.1)

## 2020-03-06 LAB — TSH: TSH: 1.47 mIU/L (ref 0.40–4.50)

## 2020-04-07 ENCOUNTER — Encounter: Payer: Self-pay | Admitting: Family Medicine

## 2020-04-07 ENCOUNTER — Ambulatory Visit (INDEPENDENT_AMBULATORY_CARE_PROVIDER_SITE_OTHER): Payer: No Typology Code available for payment source | Admitting: Family Medicine

## 2020-04-07 ENCOUNTER — Other Ambulatory Visit (HOSPITAL_COMMUNITY)
Admission: RE | Admit: 2020-04-07 | Discharge: 2020-04-07 | Disposition: A | Discharge status: Home / Self Care | Payer: No Typology Code available for payment source | Source: Ambulatory Visit | Attending: Family Medicine | Admitting: Family Medicine

## 2020-04-07 ENCOUNTER — Other Ambulatory Visit: Payer: Self-pay

## 2020-04-07 VITALS — BP 136/82 | HR 93 | Temp 97.3°F | Resp 14 | Ht 64.0 in | Wt 171.2 lb

## 2020-04-07 DIAGNOSIS — Z124 Encounter for screening for malignant neoplasm of cervix: Secondary | ICD-10-CM

## 2020-04-07 DIAGNOSIS — Z0000 Encounter for general adult medical examination without abnormal findings: Secondary | ICD-10-CM

## 2020-04-07 MED ORDER — NYSTATIN-TRIAMCINOLONE 100000-0.1 UNIT/GM-% EX OINT: 1.0000 "application " | TOPICAL_OINTMENT | Freq: Two times a day (BID) | CUTANEOUS | 1 refills | Status: DC | PRN

## 2020-04-07 NOTE — Progress Notes (Signed)
Patient: Dawn Wells, Female    DOB: 02/16/1956, 64 y.o.   MRN: [id number] Towanda Malkin, MD Visit Date: 04/07/2020  Today's Provider: Delsa Grana, PA-C   Chief Complaint  Patient presents with  . Annual Exam   Subjective:   Annual physical exam:  Dawn Wells is a 64 y.o. female who presents today for complete physical exam:  Exercise/Activity:  Walks dogs twice a day - walks over a mile a day Diet/nutrition:  No particular diet Sleep:  No problem going to sleep but trouble staying to sleep for decades   USPSTF grade A and B recommendations - reviewed and addressed today  Depression:  Phq 9 completed today by patient, was reviewed by me with patient in the room PHQ score is neg, pt feels good, on prozac 20 mg - been on for years, no problems or concerns PHQ 2/9 Scores 04/07/2020 04/07/2020 03/06/2020 03/07/2019  PHQ - 2 Score 0 0 0 0  PHQ- 9 Score 0 0 0 -   Depression screen [facility] 2/9 04/07/2020 04/07/2020 03/06/2020 03/07/2019 03/06/2018  Decreased Interest 0 0 0 0 0  Down, Depressed, Hopeless 0 0 0 0 0  PHQ - 2 Score 0 0 0 0 0  Altered sleeping 0 0 0 - -  Tired, decreased energy 0 0 0 - -  Change in appetite 0 0 0 - -  Feeling bad or failure about yourself  0 0 0 - -  Trouble concentrating 0 0 0 - -  Moving slowly or fidgety/restless 0 0 0 - -  Suicidal thoughts 0 0 0 - -  PHQ-9 Score 0 0 0 - -  Difficult doing work/chores Not difficult at all Not difficult at all Not difficult at all - -    Alcohol screening:   Office Visit from 04/07/2020 in [facility]  AUDIT-C Score  0      Immunizations and Health Maintenance: Health Maintenance  Topic Date Due  . MAMMOGRAM  03/17/2016  . PAP SMEAR-Modifier  02/16/2018  . COLONOSCOPY  04/07/2021 (Originally 09/21/2006)  . Hepatitis C Screening  04/07/2021 (Originally 08/31/1956)  . HIV Screening  04/07/2021 (Originally 09/22/1971)  . INFLUENZA VACCINE  06/01/2020  . TETANUS/TDAP  09/01/2024  .  COVID-19 Vaccine  Completed     Hep C Screening: declined  STD testing and prevention (HIV/chl/gon/syphilis):  see above, no additional testing desired by pt today- declined  Intimate partner violence:  None feels safe  Sexual History/Pain during Intercourse: Married - not sexually active right now Dry, cannot penitrate   Menstrual History/LMP/Abnormal Bleeding:    No LMP recorded. Patient is postmenopausal.  Incontinence Symptoms:  None  Breast cancer: ordered  - needs to schedule  Last Mammogram: *see HM list above BRCA gene screening:  None   Cervical cancer screening: doing pap today  Pt has possible family hx of cancers - breast, ovarian, uterine, colon (father had iliostomy?  Genetic/jewish disorder) Lymphoma   Osteoporosis:   Discussion on osteoporosis per age, including high calcium and vitamin D supplementation, weight bearing exercises rolaids daily - sometimes Vit d  Skin cancer:  Hx of skin CA -  NO Discussed atypical lesions   Colorectal cancer:   Colonoscopy is due - pt doing other screening does not want colonoscopy Discussed concerning signs and sx of CRC, pt denies melena, hematochezia, change in BM  Lung cancer:   Low Dose CT Chest recommended if Age 65-80 years, 30 pack-year currently smoking OR have  quit w/in 15years. Patient does not qualify.    Social History   Tobacco Use  . Smoking status: Former Smoker    Quit date: 02/25/1974    Years since quitting: 46.1  . Smokeless tobacco: Never Used  Substance Use Topics  . Alcohol use: No    Alcohol/week: 0.0 standard drinks  . Drug use: No       Office Visit from 04/07/2020 in [facility]  AUDIT-C Score  0      Family History  Problem Relation Age of Onset  . Leukemia Mother   . Hyperlipidemia Mother   . Hypertension Mother   . Heart disease Father   . Hyperlipidemia Father   . Hypertension Father   . Kidney disease Father   . Diabetes Sister   . Heart disease Sister    . Hyperlipidemia Sister   . Hypertension Sister      Blood pressure/Hypertension: BP Readings from Last 3 Encounters:  04/07/20 136/82  03/06/20 128/72  03/06/18 124/76    Weight/Obesity: Wt Readings from Last 3 Encounters:  04/07/20 171 lb 3.2 oz (77.7 kg)  03/06/20 171 lb 9.6 oz (77.8 kg)  03/07/19 174 lb 9.6 oz (79.2 kg)   BMI Readings from Last 3 Encounters:  04/07/20 29.39 kg/m  03/06/20 29.46 kg/m  03/07/19 29.97 kg/m     Lipids:  Lab Results  Component Value Date   CHOL 168 03/06/2020   CHOL 186 03/06/2018   CHOL 173 02/28/2017   Lab Results  Component Value Date   HDL 40 (L) 03/06/2020   HDL 40 (L) 03/06/2018   HDL 41 (L) 02/28/2017   Lab Results  Component Value Date   LDLCALC 97 03/06/2020   LDLCALC 120 (H) 03/06/2018   LDLCALC 98 02/28/2017   Lab Results  Component Value Date   TRIG 222 (H) 03/06/2020   TRIG 147 03/06/2018   TRIG 172 (H) 02/28/2017   Lab Results  Component Value Date   CHOLHDL 4.2 03/06/2020   CHOLHDL 4.7 03/06/2018   CHOLHDL 4.2 02/28/2017   No results found for: LDLDIRECT Based on the results of lipid panel his/her cardiovascular risk factor ( using [employer] )  in the next 10 years is: The 10-year ASCVD risk score [name] DC Brooke Bonito., et al., 2013) is: 5.4%   Values used to calculate the score:     Age: 89 years     Sex: Female     Is Non-Hispanic African American: No     Diabetic: No     Tobacco smoker: No     Systolic Blood Pressure: 973 mmHg     Is BP treated: No     HDL Cholesterol: 40 mg/dL     Total Cholesterol: 168 mg/dL Glucose:  Glucose, Bld  Date Value Ref Range Status  03/06/2020 92 65 - 139 mg/dL Final    Comment:    .        Non-fasting reference interval .   03/06/2018 94 65 - 99 mg/dL Final    Comment:    .            Fasting reference interval .   02/28/2017 88 65 - 99 mg/dL Final   Hypertension: BP Readings from Last 3 Encounters:  04/07/20 136/82  03/06/20 128/72  03/06/18 124/76     Obesity: Wt Readings from Last 3 Encounters:  04/07/20 171 lb 3.2 oz (77.7 kg)  03/06/20 171 lb 9.6 oz (77.8 kg)  03/07/19 174 lb  9.6 oz (79.2 kg)   BMI Readings from Last 3 Encounters:  04/07/20 29.39 kg/m  03/06/20 29.46 kg/m  03/07/19 29.97 kg/m     Social History      She        Social History   Socioeconomic History  . Marital status: Married    Spouse name: Richard  . Number of children: 2  . Years of education: Not on file  . Highest education level: Master's degree (e.g., MA, MS, MEng, MEd, MSW, MBA)  Occupational History  . Occupation: Engineer, civil (consulting) with [employer]  Tobacco Use  . Smoking status: Former Smoker    Quit date: 02/25/1974    Years since quitting: 46.1  . Smokeless tobacco: Never Used  Substance and Sexual Activity  . Alcohol use: No    Alcohol/week: 0.0 standard drinks  . Drug use: No  . Sexual activity: Not Currently  Other Topics Concern  . Not on file  Social History Narrative   Husband has Dementia      Patient has 3 grandkids (2 boys & 1 girl)   Social Determinants of Health   Financial Resource Strain:   . Difficulty of Paying Living Expenses:   Food Insecurity:   . Worried About Charity fundraiser in the Last Year:   . Arboriculturist in the Last Year:   Transportation Needs:   . Film/video editor (Medical):   Marland Kitchen Lack of Transportation (Non-Medical):   Physical Activity:   . Days of Exercise per Week:   . Minutes of Exercise per Session:   Stress:   . Feeling of Stress :   Social Connections:   . Frequency of Communication with Friends and Family:   . Frequency of Social Gatherings with Friends and Family:   . Attends Religious Services:   . Active Member of Clubs or Organizations:   . Attends Archivist Meetings:   Marland Kitchen Marital Status:     Family History        Family History  Problem Relation Age of Onset  . Leukemia Mother   . Hyperlipidemia Mother   . Hypertension Mother   .  Heart disease Father   . Hyperlipidemia Father   . Hypertension Father   . Kidney disease Father   . Diabetes Sister   . Heart disease Sister   . Hyperlipidemia Sister   . Hypertension Sister     Patient Active Problem List   Diagnosis Date Noted  . Mixed hyperlipidemia 03/06/2020  . Stage 3a chronic kidney disease 03/06/2020  . Chronic migraine without aura without status migrainosus, not intractable 03/06/2020  . Caregiver stress 03/21/2016  . Cervical cancer screening 02/26/2016  . Preventative health care 02/26/2016  . Dyslipidemia 06/12/2015  . Medication monitoring encounter 06/12/2015  . Depression, major, recurrent, mild ([place]) 06/12/2015  . Gastroesophageal reflux disease without esophagitis 06/12/2015  . Headache, migraine 06/12/2015  . Hypothyroidism 06/12/2015    Past Surgical History:  Procedure Laterality Date  . CESAREAN SECTION     X2  . TONSILLECTOMY       Current Outpatient Medications:  .  aspirin-acetaminophen-caffeine (EXCEDRIN MIGRAINE) 250-250-65 MG tablet, Take 1 tablet by mouth 2 (two) times daily as needed for headache., Disp: , Rfl:  .  atorvastatin (LIPITOR) 20 MG tablet, Take 1 tablet (20 mg total) by mouth at bedtime., Disp: 90 tablet, Rfl: 3 .  FLUoxetine (PROZAC) 20 MG capsule, Take 1 capsule (20 mg total) by mouth  daily., Disp: 90 capsule, Rfl: 3 .  levothyroxine (SYNTHROID) 88 MCG tablet, Take 1 tablet (88 mcg total) by mouth daily., Disp: 90 tablet, Rfl: 3 .  SUMAtriptan (IMITREX) 100 MG tablet, take 1/2 to 1 tablet by mouth immediately and may repeat after 2 hours MAX DAILY DOSE OF 2., Disp: 9 tablet, Rfl: 5  No Known Allergies  Patient Care Team: Towanda Malkin, MD as PCP - General (Internal Medicine)  Review of Systems  Constitutional: Negative.  Negative for activity change, appetite change, fatigue and unexpected weight change.  HENT: Negative.   Eyes: Negative.   Respiratory: Negative.  Negative for shortness of  breath.   Cardiovascular: Negative.  Negative for chest pain, palpitations and leg swelling.  Gastrointestinal: Negative.  Negative for abdominal pain and blood in stool.  Endocrine: Negative.   Genitourinary: Negative.   Musculoskeletal: Negative.  Negative for arthralgias, gait problem, joint swelling and myalgias.  Skin: Negative.  Negative for color change, pallor and rash.  Allergic/Immunologic: Negative.   Neurological: Negative.  Negative for syncope and weakness.  Hematological: Negative.   Psychiatric/Behavioral: Negative.  Negative for confusion, dysphoric mood, self-injury and suicidal ideas. The patient is not nervous/anxious.      I personally reviewed active problem list, medication list, allergies, family history, social history, health maintenance, notes from last encounter, lab results, imaging with the patient/caregiver today.  Last OV with PCP reviewed       Objective:   Vitals:  Vitals:   04/07/20 1020  BP: 136/82  Pulse: 93  Resp: 14  Temp: (!) 97.3 F (36.3 C)  SpO2: 98%  Weight: 171 lb 3.2 oz (77.7 kg)  Height: '5\' 4"'  (1.626 m)    Body mass index is 29.39 kg/m.  Physical Exam Vitals and nursing note reviewed. Exam conducted with a chaperone present.  Constitutional:      General: She is not in acute distress.    Appearance: Normal appearance. She is not ill-appearing, toxic-appearing or diaphoretic.  HENT:     Head: Normocephalic and atraumatic.     Right Ear: External ear normal.     Left Ear: External ear normal.  Eyes:     General:        Right eye: No discharge.     Conjunctiva/sclera: Conjunctivae normal.  Cardiovascular:     Rate and Rhythm: Normal rate and regular rhythm.     Pulses: Normal pulses.     Heart sounds: Normal heart sounds.  Pulmonary:     Effort: Pulmonary effort is normal. No respiratory distress.     Breath sounds: Normal breath sounds. No wheezing or rales.  Chest:     Breasts:        Right: Normal. No swelling,  bleeding, inverted nipple, mass, nipple discharge, skin change or tenderness.        Left: Normal. No swelling, bleeding, inverted nipple, mass, nipple discharge, skin change or tenderness.  Abdominal:     General: Bowel sounds are normal.  Genitourinary:    Labia:        Right: No lesion.        Left: Rash present. No lesion.      Urethra: No prolapse, urethral pain, urethral swelling or urethral lesion.     Vagina: Normal.     Cervix: Normal.     Uterus: Normal.      Adnexa: Right adnexa normal and left adnexa normal.     Comments: Tight introitus and vaginal walls PAP obtained with broom  Musculoskeletal:     Right lower leg: No edema.     Left lower leg: No edema.  Lymphadenopathy:     Upper Body:     Right upper body: No supraclavicular, axillary or pectoral adenopathy.     Left upper body: No supraclavicular, axillary or pectoral adenopathy.  Skin:    General: Skin is warm.  Neurological:     Mental Status: She is alert.     Coordination: Coordination normal.  Psychiatric:        Mood and Affect: Mood normal.        Behavior: Behavior normal.     Fall Risk: Fall Risk  04/07/2020 03/06/2020 03/07/2019 03/06/2018 02/28/2017  Falls in the past year? 0 0 0 No No  Number falls in past yr: 0 0 0 - -  Injury with Fall? 0 0 0 - -  Follow up - Falls evaluation completed - - -    Functional Status Survey: Is the patient deaf or have difficulty hearing?: No Does the patient have difficulty seeing, even when wearing glasses/contacts?: No Does the patient have difficulty concentrating, remembering, or making decisions?: No Does the patient have difficulty walking or climbing stairs?: No Does the patient have difficulty dressing or bathing?: No Does the patient have difficulty doing errands alone such as visiting a doctor's office or shopping?: No   Assessment & Plan:    CPE completed today  . USPSTF grade A and B recommendations reviewed with patient; age-appropriate  recommendations, preventive care, screening tests, etc discussed and encouraged; healthy living encouraged; see AVS for patient education given to patient  . Discussed importance of 150 minutes of physical activity weekly, AHA exercise recommendations given to pt in AVS/handout  . Discussed importance of healthy diet:  eating lean meats and proteins, avoiding trans fats and saturated fats, avoid simple sugars and excessive carbs in diet, eat 6 servings of fruit/vegetables daily and drink plenty of water and avoid sweet beverages.    . Recommended pt to do annual eye exam and routine dental exams/cleanings  . Depression, alcohol, fall screening completed as documented above and per flowsheets  . Reviewed Health Maintenance: Health Maintenance  Topic Date Due  . MAMMOGRAM  03/17/2016  . PAP SMEAR-Modifier  02/16/2018  . COLONOSCOPY  04/07/2021 (Originally 09/21/2006)  . Hepatitis C Screening  04/07/2021 (Originally 10-19-1956)  . HIV Screening  04/07/2021 (Originally 09/22/1971)  . INFLUENZA VACCINE  06/01/2020  . TETANUS/TDAP  09/01/2024  . COVID-19 Vaccine  Completed    . Immunizations: Immunization History  Administered Date(s) Administered  . Influenza,inj,Quad PF,6+ Mos 08/03/2017, 08/14/2018, 08/09/2019  . Influenza-Unspecified 07/25/2016  . PFIZER SARS-COV-2 Vaccination 01/03/2020, 01/24/2020   1. Adult general medical exam Labs done recently and reviewed, no other screening labs needed today - Cytology - PAP  2. Screening for malignant neoplasm of cervix PAP done - Cytology - PAP  Encouraged pt to try colonoscopy - she has concerns from 30 + years ago - encouraged her to try with new and different prep and procedure methods/techniques  Breast CA screening due - given [place] number to call and schedule - pt previously required diagnostic and [place] - encouraged her to find out if she needs repeat diagnostic - to let us know so we can change the order.  Recent sudden death  of her son in 03-16-2020 - of massive MI?   Delsa Grana, PA-C 04/07/20 10:31 AM  [facility]

## 2020-04-07 NOTE — Patient Instructions (Addendum)
.[facility] [address] [place],  [place]  [address] Get Driving Directions Main: [phone]    Preventive Care 64-64 Years Old, Female Preventive care refers to visits with your health care provider and lifestyle choices that can promote health and wellness. This includes:  A yearly physical exam. This may also be called an annual well check.  Regular dental visits and eye exams.  Immunizations.  Screening for certain conditions.  Healthy lifestyle choices, such as eating a healthy diet, getting regular exercise, not using drugs or products that contain nicotine and tobacco, and limiting alcohol use. What can I expect for my preventive care visit? Physical exam Your health care provider will check your:  Height and weight. This may be used to calculate body mass index (BMI), which tells if you are at a healthy weight.  Heart rate and blood pressure.  Skin for abnormal spots. Counseling Your health care provider may ask you questions about your:  Alcohol, tobacco, and drug use.  Emotional well-being.  Home and relationship well-being.  Sexual activity.  Eating habits.  Work and work environment.  Method of birth control.  Menstrual cycle.  Pregnancy history. What immunizations do I need?  Influenza (flu) vaccine  This is recommended every year. Tetanus, diphtheria, and pertussis (Tdap) vaccine  You may need a Td booster every 10 years. Varicella (chickenpox) vaccine  You may need this if you have not been vaccinated. Zoster (shingles) vaccine  You may need this after age 60. Measles, mumps, and rubella (MMR) vaccine  You may need at least one dose of MMR if you were born in 1957 or later. You may also need a second dose. Pneumococcal conjugate (PCV13) vaccine  You may need this if you have certain conditions and were not previously vaccinated. Pneumococcal polysaccharide (PPSV23) vaccine  You may need  one or two doses if you smoke cigarettes or if you have certain conditions. Meningococcal conjugate (MenACWY) vaccine  You may need this if you have certain conditions. Hepatitis A vaccine  You may need this if you have certain conditions or if you travel or work in places where you may be exposed to hepatitis A. Hepatitis B vaccine  You may need this if you have certain conditions or if you travel or work in places where you may be exposed to hepatitis B. Haemophilus influenzae type b (Hib) vaccine  You may need this if you have certain conditions. Human papillomavirus (HPV) vaccine  If recommended by your health care provider, you may need three doses over 6 months. You may receive vaccines as individual doses or as more than one vaccine together in one shot (combination vaccines). Talk with your health care provider about the risks and benefits of combination vaccines. What tests do I need? Blood tests  Lipid and cholesterol levels. These may be checked every 5 years, or more frequently if you are over 50 years old.  Hepatitis C test.  Hepatitis B test. Screening  Lung cancer screening. You may have this screening every year starting at age 55 if you have a 30-pack-year history of smoking and currently smoke or have quit within the past 15 years.  Colorectal cancer screening. All adults should have this screening starting at age 50 and continuing until age 75. Your health care provider may recommend screening at age 45 if you are at increased risk. You will have tests every 1-10 years, depending on your results and the type of screening test.    Diabetes screening. This is done by checking your blood sugar (glucose) after you have not eaten for a while (fasting). You may have this done every 1-3 years.  Mammogram. This may be done every 1-2 years. Talk with your health care provider about when you should start having regular mammograms. This may depend on whether you have a family  history of breast cancer.  BRCA-related cancer screening. This may be done if you have a family history of breast, ovarian, tubal, or peritoneal cancers.  Pelvic exam and Pap test. This may be done every 3 years starting at age 42. Starting at age 73, this may be done every 5 years if you have a Pap test in combination with an HPV test. Other tests  Sexually transmitted disease (STD) testing.  Bone density scan. This is done to screen for osteoporosis. You may have this scan if you are at high risk for osteoporosis. Follow these instructions at home: Eating and drinking  Eat a diet that includes fresh fruits and vegetables, whole grains, lean protein, and low-fat dairy.  Take vitamin and mineral supplements as recommended by your health care provider.  Do not drink alcohol if: ? Your health care provider tells you not to drink. ? You are pregnant, may be pregnant, or are planning to become pregnant.  If you drink alcohol: ? Limit how much you have to 0-1 drink a day. ? Be aware of how much alcohol is in your drink. In the [place] one drink equals one 12 oz bottle of beer (355 mL), one 5 oz glass of wine (148 mL), or one 1 oz glass of hard liquor (44 mL). Lifestyle  Take daily care of your teeth and gums.  Stay active. Exercise for at least 30 minutes on 5 or more days each week.  Do not use any products that contain nicotine or tobacco, such as cigarettes, e-cigarettes, and chewing tobacco. If you need help quitting, ask your health care provider.  If you are sexually active, practice safe sex. Use a condom or other form of birth control (contraception) in order to prevent pregnancy and STIs (sexually transmitted infections).  If told by your health care provider, take low-dose aspirin daily starting at age 17. What's next?  Visit your health care provider once a year for a well check visit.  Ask your health care provider how often you should have your eyes and teeth  checked.  Stay up to date on all vaccines. This information is not intended to replace advice given to you by your health care provider. Make sure you discuss any questions you have with your health care provider. Document Revised: 06/29/2018 Document Reviewed: 06/29/2018 Elsevier Patient Education  2020 [employer].   Preventing Osteoporosis, Adult Osteoporosis is a condition that causes the bones to lose density. This means that the bones become thinner, and the normal spaces in bone tissue become larger. Low bone density can make the bones weak and cause them to break more easily. Osteoporosis cannot always be prevented, but you can take steps to lower your risk of developing this condition. How can this condition affect me? If you develop osteoporosis, you will be more likely to break bones in your wrist, spine, or hip. Even a minor accident or injury can be enough to break weak bones. The bones will also be slower to heal. Osteoporosis can cause other problems as well, such as a stooped posture or trouble with movement. Osteoporosis can occur with aging. As you get  older, you may lose bone tissue more quickly, or it may be replaced more slowly. Osteoporosis is more likely to develop if you have poor nutrition or do not get enough calcium or vitamin D. Other lifestyle factors can also play a role. By eating a well-balanced diet and making lifestyle changes, you can help keep your bones strong and healthy, lowering your chances of developing osteoporosis. What can increase my risk? The following factors may make you more likely to develop osteoporosis:  Having a family history of the condition.  Having poor nutrition or not getting enough calcium or vitamin D.  Using certain medicines, such as steroid medicines or antiseizure medicines.  Being any of the following: ? 23 years of age or older. ? Female. ? A woman who has gone through menopause (is postmenopausal). ? White (Caucasian)  or of Asian descent.  Smoking or having a history of smoking.  Not being physically active (being sedentary).  Having a small body frame. What actions can I take to prevent this?  Get enough calcium   Make sure you get enough calcium every day. Calcium is the most important mineral for bone health. Most people can get enough calcium from their diet, but supplements may be recommended for people who are at risk for osteoporosis. Follow these guidelines: ? If you are age 84 or younger, aim to get 1,000 mg of calcium every day. ? If you are older than age 76, aim to get 1,200 mg of calcium every day.  Good sources of calcium include: ? Dairy products, such as low-fat or nonfat milk, cheese, and yogurt. ? Dark green leafy vegetables, such as bok choy and broccoli. ? Foods that have had calcium added to them (calcium-fortified foods), such as orange juice, cereal, bread, soy beverages, and tofu products. ? Nuts, such as almonds.  Check nutrition labels to see how much calcium is in a food or drink. Get enough vitamin D  Try to get enough vitamin D every day. Vitamin D is the most essential vitamin for bone health. It helps the body absorb calcium. Follow these guidelines for how much vitamin D to get from food: ? If you are age 53 or younger, aim to get at least 600 international units (IU) every day. Your health care provider may suggest more. ? If you are older than age 68, aim to get at least 800 international units every day. Your health care provider may suggest more.  Good sources of vitamin D in your diet include: ? Egg yolks. ? Oily fish, such as salmon, sardines, and tuna. ? Milk and cereal fortified with vitamin D.  Your body also makes vitamin D when you are out in the sun. Exposing the bare skin on your face, arms, legs, or back to the sun for no more than 30 minutes a day, 2 times a week is more than enough. Beyond that, make sure you use sunblock to protect your skin from  sunburn, which increases your risk for skin cancer. Exercise  Stay active and get exercise every day.  Ask your health care provider what types of exercise are best for you. Weight-bearing and strength-building activities are important for building and maintaining healthy bones. Some examples of these types of activities include: ? Walking and hiking. ? Jogging and running. ? Dancing. ? Gym exercises. ? Lifting weights. ? Tennis and racquetball. ? Climbing stairs. ? Aerobics. Make other lifestyle changes  Do not use any products that contain nicotine or tobacco, such  as cigarettes, e-cigarettes, and chewing tobacco. If you need help quitting, ask your health care provider.  Lose weight if you are overweight.  If you drink alcohol: ? Limit how much you use to:  0-1 drink a day for nonpregnant women.  0-2 drinks a day for men. ? Be aware of how much alcohol is in your drink. In the [place] one drink equals one 12 oz bottle of beer (355 mL), one 5 oz glass of wine (148 mL), or one 1 oz glass of hard liquor (44 mL). Where to find support If you need help making changes to prevent osteoporosis, talk with your health care provider. You can ask for a referral to a diet and nutrition specialist (dietitian) and a physical therapist. Where to find more information Learn more about osteoporosis from:  NIH Osteoporosis and Related Bone Diseases National Resource Center: www.bones.nih.gov  U.S. Office on Women's Health: www.womenshealth.gov  National Osteoporosis Foundation: www.nof.org Summary  Osteoporosis is a condition that causes weak bones that are more likely to break.  Eat a healthy diet, making sure you get enough calcium and vitamin D, and stay active by getting regular exercise to help prevent osteoporosis.  Other ways to reduce your risk of osteoporosis include maintaining a healthy weight and avoiding alcohol and products that contain nicotine or tobacco. This information  is not intended to replace advice given to you by your health care provider. Make sure you discuss any questions you have with your health care provider. Document Revised: 05/18/2019 Document Reviewed: 05/18/2019 Elsevier Patient Education  2020 Elsevier Inc.  

## 2020-04-08 LAB — CYTOLOGY - PAP
Comment: NEGATIVE
Diagnosis: NEGATIVE
High risk HPV: NEGATIVE

## 2020-05-21 ENCOUNTER — Other Ambulatory Visit: Payer: Self-pay

## 2020-05-21 DIAGNOSIS — E785 Hyperlipidemia, unspecified: Secondary | ICD-10-CM

## 2020-05-21 MED ORDER — ATORVASTATIN CALCIUM 20 MG PO TABS: 20.0000 mg | ORAL_TABLET | Freq: Every day | ORAL | 3 refills | Status: DC

## 2020-06-05 ENCOUNTER — Other Ambulatory Visit: Payer: Self-pay

## 2020-06-05 DIAGNOSIS — F330 Major depressive disorder, recurrent, mild: Secondary | ICD-10-CM

## 2020-06-05 DIAGNOSIS — E039 Hypothyroidism, unspecified: Secondary | ICD-10-CM

## 2020-06-05 MED ORDER — FLUOXETINE HCL 20 MG PO CAPS: 20.0000 mg | ORAL_CAPSULE | Freq: Every day | ORAL | 3 refills | Status: DC

## 2020-06-05 MED ORDER — LEVOTHYROXINE SODIUM 88 MCG PO TABS: 88.0000 ug | ORAL_TABLET | Freq: Every day | ORAL | 3 refills | Status: DC

## 2020-09-03 ENCOUNTER — Other Ambulatory Visit: Payer: Self-pay | Admitting: Family Medicine

## 2020-09-03 DIAGNOSIS — G43709 Chronic migraine without aura, not intractable, without status migrainosus: Secondary | ICD-10-CM

## 2020-09-03 NOTE — Telephone Encounter (Signed)
Patient requesting SUMAtriptan (IMITREX) 100 MG tablet, pharmacy advised request was fax 1 week ago, informed patient please allow 48 to 72 hour turn around time  [id number] [place], [place] - [address] AT [facility] [address] [facility] [facility] Phone:  [phone]  Fax:  [phone]

## 2020-09-03 NOTE — Telephone Encounter (Signed)
Requested medication (s) are due for refill today: Yes  Requested medication (s) are on the active medication list: Yes  Last refill:  03/07/19  Future visit scheduled: Yes  Notes to clinic:  Prescription has expired.    Requested Prescriptions  Pending Prescriptions Disp Refills   SUMAtriptan (IMITREX) 100 MG tablet 9 tablet 5    Sig: take 1/2 to 1 tablet by mouth immediately and may repeat after 2 hours MAX DAILY DOSE OF 2.      Neurology:  Migraine Therapy - Triptan Passed - 09/03/2020 10:36 AM      Passed - Last BP in normal range    BP Readings from Last 1 Encounters:  04/07/20 136/82          Passed - Valid encounter within last 12 months    Recent Outpatient Visits           4 months ago Adult general medical exam   [facility] Danelle Berry, PA-C   6 months ago Hypothyroidism, unspecified type   [facility] Welford Roche D, MD   1 year ago Chronic migraine without aura without status migrainosus, not intractable   [facility] [place], Lanora Manis E, NP   2 years ago Chronic migraine without aura without status migrainosus, not intractable   [facility] [place], Janit Bern, MD   3 years ago Hypothyroidism, unspecified type   [facility] [place], Janit Bern, MD       Future Appointments             In 7 months Danelle Berry, PA-C [facility], [facility]

## 2020-09-05 MED ORDER — SUMATRIPTAN SUCCINATE 100 MG PO TABS: ORAL_TABLET | ORAL | 5 refills | Status: DC

## 2021-04-10 ENCOUNTER — Encounter: Payer: No Typology Code available for payment source | Admitting: Family Medicine

## 2021-04-23 ENCOUNTER — Other Ambulatory Visit: Payer: Self-pay

## 2021-04-23 ENCOUNTER — Encounter: Payer: Self-pay | Admitting: Unknown Physician Specialty

## 2021-04-23 ENCOUNTER — Ambulatory Visit (INDEPENDENT_AMBULATORY_CARE_PROVIDER_SITE_OTHER): Payer: No Typology Code available for payment source | Admitting: Unknown Physician Specialty

## 2021-04-23 VITALS — BP 132/74 | HR 90 | Temp 98.4°F | Resp 16 | Ht 64.0 in | Wt 171.6 lb

## 2021-04-23 DIAGNOSIS — E785 Hyperlipidemia, unspecified: Secondary | ICD-10-CM | POA: Diagnosis not present

## 2021-04-23 DIAGNOSIS — E039 Hypothyroidism, unspecified: Secondary | ICD-10-CM

## 2021-04-23 DIAGNOSIS — G43709 Chronic migraine without aura, not intractable, without status migrainosus: Secondary | ICD-10-CM

## 2021-04-23 DIAGNOSIS — F330 Major depressive disorder, recurrent, mild: Secondary | ICD-10-CM | POA: Diagnosis not present

## 2021-04-23 DIAGNOSIS — Z0000 Encounter for general adult medical examination without abnormal findings: Secondary | ICD-10-CM

## 2021-04-23 LAB — COMPREHENSIVE METABOLIC PANEL
AG Ratio: 1.6 (calc) (ref 1.0–2.5)
ALT: 24 U/L (ref 6–29)
AST: 21 U/L (ref 10–35)
Albumin: 4.4 g/dL (ref 3.6–5.1)
Alkaline phosphatase (APISO): 91 U/L (ref 37–153)
BUN/Creatinine Ratio: 18 (calc) (ref 6–22)
BUN: 18 mg/dL (ref 7–25)
CO2: 27 mmol/L (ref 20–32)
Calcium: 9.9 mg/dL (ref 8.6–10.4)
Chloride: 103 mmol/L (ref 98–110)
Creat: 1 mg/dL — ABNORMAL HIGH (ref 0.50–0.99)
Globulin: 2.7 g/dL (calc) (ref 1.9–3.7)
Glucose, Bld: 97 mg/dL (ref 65–99)
Potassium: 4.5 mmol/L (ref 3.5–5.3)
Sodium: 140 mmol/L (ref 135–146)
Total Bilirubin: 0.6 mg/dL (ref 0.2–1.2)
Total Protein: 7.1 g/dL (ref 6.1–8.1)

## 2021-04-23 LAB — CBC WITH DIFFERENTIAL/PLATELET
Absolute Monocytes: 539 cells/uL (ref 200–950)
Basophils Absolute: 62 cells/uL (ref 0–200)
Basophils Relative: 1 %
Eosinophils Absolute: 87 cells/uL (ref 15–500)
Eosinophils Relative: 1.4 %
HCT: 40 % (ref 35.0–45.0)
Hemoglobin: 13.3 g/dL (ref 11.7–15.5)
Lymphs Abs: 2003 cells/uL (ref 850–3900)
MCH: 31.1 pg (ref 27.0–33.0)
MCHC: 33.3 g/dL (ref 32.0–36.0)
MCV: 93.7 fL (ref 80.0–100.0)
MPV: 10.1 fL (ref 7.5–12.5)
Monocytes Relative: 8.7 %
Neutro Abs: 3509 cells/uL (ref 1500–7800)
Neutrophils Relative %: 56.6 %
Platelets: 284 10*3/uL (ref 140–400)
RBC: 4.27 10*6/uL (ref 3.80–5.10)
RDW: 12.7 % (ref 11.0–15.0)
Total Lymphocyte: 32.3 %
WBC: 6.2 10*3/uL (ref 3.8–10.8)

## 2021-04-23 LAB — TSH: TSH: 2.49 mIU/L (ref 0.40–4.50)

## 2021-04-23 LAB — LIPID PANEL
Cholesterol: 182 mg/dL (ref <200)
HDL: 43 mg/dL — ABNORMAL LOW (ref >=50)
LDL Cholesterol (Calc): 105 mg/dL (calc) — ABNORMAL HIGH
Non-HDL Cholesterol (Calc): 139 mg/dL (calc) — ABNORMAL HIGH (ref <130)
Total CHOL/HDL Ratio: 4.2 (calc) (ref <5.0)
Triglycerides: 225 mg/dL — ABNORMAL HIGH (ref <150)

## 2021-04-23 MED ORDER — SUMATRIPTAN SUCCINATE 100 MG PO TABS: ORAL_TABLET | ORAL | 5 refills | Status: DC

## 2021-04-23 MED ORDER — LEVOTHYROXINE SODIUM 88 MCG PO TABS: 88.0000 ug | ORAL_TABLET | Freq: Every day | ORAL | 3 refills | Status: DC

## 2021-04-23 MED ORDER — ATORVASTATIN CALCIUM 20 MG PO TABS: 20.0000 mg | ORAL_TABLET | Freq: Every day | ORAL | 3 refills | Status: DC

## 2021-04-23 MED ORDER — FLUOXETINE HCL 20 MG PO CAPS: 20.0000 mg | ORAL_CAPSULE | Freq: Every day | ORAL | 3 refills | Status: DC

## 2021-04-23 NOTE — Assessment & Plan Note (Signed)
Check TSH.  Continue present medications if euthyroid

## 2021-04-23 NOTE — Assessment & Plan Note (Signed)
Check labs today.  Continue present meds if stable

## 2021-04-23 NOTE — Patient Instructions (Signed)
Preventive Care 65-65 Years Old, Female Preventive care refers to lifestyle choices and visits with your health care provider that can promote health and wellness. This includes: A yearly physical exam. This is also called an annual wellness visit. Regular dental and eye exams. Immunizations. Screening for certain conditions. Healthy lifestyle choices, such as: Eating a healthy diet. Getting regular exercise. Not using drugs or products that contain nicotine and tobacco. Limiting alcohol use. What can I expect for my preventive care visit? Physical exam Your health care provider will check your: Height and weight. These may be used to calculate your BMI (body mass index). BMI is a measurement that tells if you are at a healthy weight. Heart rate and blood pressure. Body temperature. Skin for abnormal spots. Counseling Your health care provider may ask you questions about your: Past medical problems. Family's medical history. Alcohol, tobacco, and drug use. Emotional well-being. Home life and relationship well-being. Sexual activity. Diet, exercise, and sleep habits. Work and work Statistician. Access to firearms. Method of birth control. Menstrual cycle. Pregnancy history. What immunizations do I need?  Vaccines are usually given at various ages, according to a schedule. Your health care provider will recommend vaccines for you based on your age, medicalhistory, and lifestyle or other factors, such as travel or where you work. What tests do I need? Blood tests Lipid and cholesterol levels. These may be checked every 5 years, or more often if you are over 37 years old. Hepatitis C test. Hepatitis B test. Screening Lung cancer screening. You may have this screening every year starting at age 30 if you have a 30-pack-year history of smoking and currently smoke or have quit within the past 15 years. Colorectal cancer screening. All adults should have this screening starting at  age 23 and continuing until age 3. Your health care provider may recommend screening at age 88 if you are at increased risk. You will have tests every 1-10 years, depending on your results and the type of screening test. Diabetes screening. This is done by checking your blood sugar (glucose) after you have not eaten for a while (fasting). You may have this done every 1-3 years. Mammogram. This may be done every 1-2 years. Talk with your health care provider about when you should start having regular mammograms. This may depend on whether you have a family history of breast cancer. BRCA-related cancer screening. This may be done if you have a family history of breast, ovarian, tubal, or peritoneal cancers. Pelvic exam and Pap test. This may be done every 3 years starting at age 79. Starting at age 54, this may be done every 5 years if you have a Pap test in combination with an HPV test. Other tests STD (sexually transmitted disease) testing, if you are at risk. Bone density scan. This is done to screen for osteoporosis. You may have this scan if you are at high risk for osteoporosis. Talk with your health care provider about your test results, treatment options,and if necessary, the need for more tests. Follow these instructions at home: Eating and drinking  Eat a diet that includes fresh fruits and vegetables, whole grains, lean protein, and low-fat dairy products. Take vitamin and mineral supplements as recommended by your health care provider. Do not drink alcohol if: Your health care provider tells you not to drink. You are pregnant, may be pregnant, or are planning to become pregnant. If you drink alcohol: Limit how much you have to 0-1 drink a day. Be aware  of how much alcohol is in your drink. In the [place] one drink equals one 12 oz bottle of beer (355 mL), one 5 oz glass of wine (148 mL), or one 1 oz glass of hard liquor (44 mL).  Lifestyle Take daily care of your teeth and  gums. Brush your teeth every morning and night with fluoride toothpaste. Floss one time each day. Stay active. Exercise for at least 30 minutes 5 or more days each week. Do not use any products that contain nicotine or tobacco, such as cigarettes, e-cigarettes, and chewing tobacco. If you need help quitting, ask your health care provider. Do not use drugs. If you are sexually active, practice safe sex. Use a condom or other form of protection to prevent STIs (sexually transmitted infections). If you do not wish to become pregnant, use a form of birth control. If you plan to become pregnant, see your health care provider for a prepregnancy visit. If told by your health care provider, take low-dose aspirin daily starting at age 29. Find healthy ways to cope with stress, such as: Meditation, yoga, or listening to music. Journaling. Talking to a trusted person. Spending time with friends and family. Safety Always wear your seat belt while driving or riding in a vehicle. Do not drive: If you have been drinking alcohol. Do not ride with someone who has been drinking. When you are tired or distracted. While texting. Wear a helmet and other protective equipment during sports activities. If you have firearms in your house, make sure you follow all gun safety procedures. What's next? Visit your health care provider once a year for an annual wellness visit. Ask your health care provider how often you should have your eyes and teeth checked. Stay up to date on all vaccines. This information is not intended to replace advice given to you by your health care provider. Make sure you discuss any questions you have with your healthcare provider. Document Revised: 07/22/2020 Document Reviewed: 06/29/2018 Elsevier Patient Education  2022 [employer].

## 2021-04-23 NOTE — Assessment & Plan Note (Signed)
Stable, continue present medications.   

## 2021-04-23 NOTE — Assessment & Plan Note (Signed)
Stable, continue present medications. Daily Exedrine but limits herself to 2/day.  Unable to wean off at this time

## 2021-04-23 NOTE — Progress Notes (Signed)
BP 132/74   Pulse 90   Temp 98.4 F (36.9 C) (Oral)   Resp 16   Ht 5\' 4"  (1.626 m)   Wt 171 lb 9.6 oz (77.8 kg)   SpO2 97%   BMI 29.46 kg/m    Subjective:    Patient ID: , female    DOB: [birth year], 65 y.o.   MRN: [id number]  HPI: Dawn Wells is a 65 y.o. female  Chief Complaint  Patient presents with   Annual Exam    Hyperlipidemia Using medications without problems: No Muscle aches  Diet compliance:Exercise: Admits to not eating the best due to caregiver demands.    Depression A lot of caregiver stress.  Fluoxetine is working well  Depression screen [facility] 2/9 04/23/2021 04/07/2020 04/07/2020 03/06/2020 03/07/2019  Decreased Interest 0 0 0 0 0  Down, Depressed, Hopeless 0 0 0 0 0  PHQ - 2 Score 0 0 0 0 0  Altered sleeping 0 0 0 0 -  Tired, decreased energy 0 0 0 0 -  Change in appetite 0 0 0 0 -  Feeling bad or failure about yourself  0 0 0 0 -  Trouble concentrating 0 0 0 0 -  Moving slowly or fidgety/restless 0 0 0 0 -  Suicidal thoughts 0 0 0 0 -  PHQ-9 Score 0 0 0 0 -  Difficult doing work/chores Not difficult at all Not difficult at all Not difficult at all Not difficult at all -   Hypothyroid Yearly labs.    Migraines Takes daily Exedrine.  Takes Imitrex about once a month   Relevant past medical, surgical, family and social history reviewed and updated as indicated. Interim medical history since our last visit reviewed. Allergies and medications reviewed and updated.  Review of Systems  Constitutional: Negative.   HENT: Negative.    Eyes: Negative.   Respiratory: Negative.    Cardiovascular: Negative.   Gastrointestinal: Negative.   Endocrine: Negative.   Genitourinary: Negative.   Musculoskeletal: Negative.   Skin: Negative.   Allergic/Immunologic: Negative.   Neurological: Negative.   Hematological: Negative.   Psychiatric/Behavioral: Negative.     Per HPI unless specifically indicated above     Objective:    BP 132/74    Pulse 90   Temp 98.4 F (36.9 C) (Oral)   Resp 16   Ht 5\' 4"  (1.626 m)   Wt 171 lb 9.6 oz (77.8 kg)   SpO2 97%   BMI 29.46 kg/m   Wt Readings from Last 3 Encounters:  04/23/21 171 lb 9.6 oz (77.8 kg)  04/07/20 171 lb 3.2 oz (77.7 kg)  03/06/20 171 lb 9.6 oz (77.8 kg)    Physical Exam Constitutional:      General: She is not in acute distress.    Appearance: Normal appearance. She is well-developed. She is not ill-appearing.  HENT:     Head: Normocephalic and atraumatic.     Right Ear: Tympanic membrane normal.     Left Ear: Tympanic membrane normal.     Nose: Nose normal.     Mouth/Throat:     Mouth: Mucous membranes are dry.  Eyes:     General: No scleral icterus.       Right eye: No discharge.        Left eye: No discharge.     Pupils: Pupils are equal, round, and reactive to light.  Neck:     Thyroid: No thyromegaly.     Vascular: No carotid bruit.  Cardiovascular:     Rate and Rhythm: Normal rate and regular rhythm.     Heart sounds: Normal heart sounds. No murmur heard.   No friction rub. No gallop.  Pulmonary:     Effort: Pulmonary effort is normal. No respiratory distress.     Breath sounds: Normal breath sounds. No wheezing or rales.  Chest:  Breasts:    Right: Normal. No mass, nipple discharge or skin change.     Left: Normal. No mass, nipple discharge or skin change.  Abdominal:     General: Bowel sounds are normal.     Palpations: Abdomen is soft.     Tenderness: There is no abdominal tenderness. There is no right CVA tenderness, left CVA tenderness or rebound.  Musculoskeletal:        General: Normal range of motion.     Cervical back: Normal range of motion and neck supple.  Lymphadenopathy:     Cervical: No cervical adenopathy.  Skin:    General: Skin is warm and dry.     Findings: No rash.  Neurological:     Mental Status: She is alert and oriented to person, place, and time.  Psychiatric:        Speech: Speech normal.        Behavior:  Behavior normal.        Thought Content: Thought content normal.        Judgment: Judgment normal.    Results for orders placed or performed in visit on 04/07/20  Cytology - PAP  Result Value Ref Range   High risk HPV Negative    Adequacy      Satisfactory for evaluation. The presence or absence of an   Adequacy      endocervical/transformation zone component cannot be determined because   Adequacy of atrophy.    Diagnosis      - Negative for intraepithelial lesion or malignancy (NILM)   Comment Normal Reference Range HPV - Negative       Assessment & Plan:   Problem List Items Addressed This Visit       Unprioritized   Dyslipidemia (Chronic)    Check labs today.  Continue present meds if stable       Relevant Medications   atorvastatin (LIPITOR) 20 MG tablet   Other Relevant Orders   Comprehensive metabolic panel   Lipid panel   Hypothyroidism (Chronic)    Check TSH.  Continue present medications if euthyroid       Relevant Medications   levothyroxine (SYNTHROID) 88 MCG tablet   Other Relevant Orders   TSH   Chronic migraine without aura without status migrainosus, not intractable   Relevant Medications   atorvastatin (LIPITOR) 20 MG tablet   FLUoxetine (PROZAC) 20 MG capsule   SUMAtriptan (IMITREX) 100 MG tablet   Depression, major, recurrent, mild (HCC)    Stable, continue present medications.         Relevant Medications   FLUoxetine (PROZAC) 20 MG capsule   Other Visit Diagnoses     Routine general medical examination at a health care facility    -  Primary   Relevant Orders   CBC with Differential/Platelet   Comprehensive metabolic panel   Lipid panel   TSH   Fecal Globin By Immunochemistry        Refusing HIV and Hep C screening Refuses Mammogram Non-smoker 2 covid vaccines and 1 booster Refuses shingles vaccine FIT testing  Follow up plan: Return in about 1 year (around  04/23/2022).       

## 2022-02-08 ENCOUNTER — Other Ambulatory Visit: Payer: Self-pay

## 2022-02-08 DIAGNOSIS — Z1231 Encounter for screening mammogram for malignant neoplasm of breast: Secondary | ICD-10-CM

## 2022-02-08 DIAGNOSIS — Z13820 Encounter for screening for osteoporosis: Secondary | ICD-10-CM

## 2022-04-26 ENCOUNTER — Encounter: Payer: No Typology Code available for payment source | Admitting: Family Medicine

## 2022-04-27 ENCOUNTER — Ambulatory Visit (INDEPENDENT_AMBULATORY_CARE_PROVIDER_SITE_OTHER): Payer: Medicare Other | Admitting: Physician Assistant

## 2022-04-27 ENCOUNTER — Encounter: Payer: Self-pay | Admitting: Physician Assistant

## 2022-04-27 VITALS — BP 150/78 | HR 71 | Temp 97.7°F | Resp 16 | Ht 65.0 in | Wt 170.9 lb

## 2022-04-27 DIAGNOSIS — E039 Hypothyroidism, unspecified: Secondary | ICD-10-CM

## 2022-04-27 DIAGNOSIS — I10 Essential (primary) hypertension: Secondary | ICD-10-CM | POA: Diagnosis not present

## 2022-04-27 DIAGNOSIS — N1831 Chronic kidney disease, stage 3a: Secondary | ICD-10-CM

## 2022-04-27 DIAGNOSIS — E785 Hyperlipidemia, unspecified: Secondary | ICD-10-CM | POA: Diagnosis not present

## 2022-04-27 DIAGNOSIS — G43709 Chronic migraine without aura, not intractable, without status migrainosus: Secondary | ICD-10-CM

## 2022-04-27 MED ORDER — AMLODIPINE BESYLATE 5 MG PO TABS: 5.0000 mg | ORAL_TABLET | Freq: Every day | ORAL | 1 refills | Status: DC

## 2022-04-27 MED ORDER — SUMATRIPTAN SUCCINATE 100 MG PO TABS: ORAL_TABLET | ORAL | 0 refills | Status: DC

## 2022-04-27 MED ORDER — LEVOTHYROXINE SODIUM 88 MCG PO TABS: 88.0000 ug | ORAL_TABLET | Freq: Every day | ORAL | 0 refills | Status: DC

## 2022-04-27 MED ORDER — ATORVASTATIN CALCIUM 20 MG PO TABS: 20.0000 mg | ORAL_TABLET | Freq: Every day | ORAL | 0 refills | Status: DC

## 2022-04-28 LAB — COMPLETE METABOLIC PANEL WITH GFR
AG Ratio: 1.8 (calc) (ref 1.0–2.5)
ALT: 27 U/L (ref 6–29)
AST: 22 U/L (ref 10–35)
Albumin: 4.3 g/dL (ref 3.6–5.1)
Alkaline phosphatase (APISO): 97 U/L (ref 37–153)
BUN: 19 mg/dL (ref 7–25)
CO2: 25 mmol/L (ref 20–32)
Calcium: 9.4 mg/dL (ref 8.6–10.4)
Chloride: 108 mmol/L (ref 98–110)
Creat: 0.77 mg/dL (ref 0.50–1.05)
Globulin: 2.4 g/dL (calc) (ref 1.9–3.7)
Glucose, Bld: 98 mg/dL (ref 65–99)
Potassium: 4.3 mmol/L (ref 3.5–5.3)
Sodium: 142 mmol/L (ref 135–146)
Total Bilirubin: 0.6 mg/dL (ref 0.2–1.2)
Total Protein: 6.7 g/dL (ref 6.1–8.1)
eGFR: 86 mL/min/{1.73_m2} (ref >=60)

## 2022-04-28 LAB — LIPID PANEL
Cholesterol: 175 mg/dL (ref <200)
HDL: 42 mg/dL — ABNORMAL LOW (ref >=50)
LDL Cholesterol (Calc): 101 mg/dL (calc) — ABNORMAL HIGH
Non-HDL Cholesterol (Calc): 133 mg/dL (calc) — ABNORMAL HIGH (ref <130)
Total CHOL/HDL Ratio: 4.2 (calc) (ref <5.0)
Triglycerides: 203 mg/dL — ABNORMAL HIGH (ref <150)

## 2022-04-28 LAB — CBC WITH DIFFERENTIAL/PLATELET
Absolute Monocytes: 459 cells/uL (ref 200–950)
Basophils Absolute: 62 cells/uL (ref 0–200)
Basophils Relative: 1.1 %
Eosinophils Absolute: 140 cells/uL (ref 15–500)
Eosinophils Relative: 2.5 %
HCT: 38.1 % (ref 35.0–45.0)
Hemoglobin: 12.9 g/dL (ref 11.7–15.5)
Lymphs Abs: 1988 cells/uL (ref 850–3900)
MCH: 31.9 pg (ref 27.0–33.0)
MCHC: 33.9 g/dL (ref 32.0–36.0)
MCV: 94.1 fL (ref 80.0–100.0)
MPV: 9.8 fL (ref 7.5–12.5)
Monocytes Relative: 8.2 %
Neutro Abs: 2951 cells/uL (ref 1500–7800)
Neutrophils Relative %: 52.7 %
Platelets: 267 10*3/uL (ref 140–400)
RBC: 4.05 10*6/uL (ref 3.80–5.10)
RDW: 12.7 % (ref 11.0–15.0)
Total Lymphocyte: 35.5 %
WBC: 5.6 10*3/uL (ref 3.8–10.8)

## 2022-04-28 LAB — TSH: TSH: 1.61 mIU/L (ref 0.40–4.50)

## 2022-06-01 ENCOUNTER — Ambulatory Visit: Payer: Medicare Other | Admitting: Family Medicine

## 2022-06-19 ENCOUNTER — Other Ambulatory Visit: Payer: Self-pay | Admitting: Physician Assistant

## 2022-06-19 DIAGNOSIS — I10 Essential (primary) hypertension: Secondary | ICD-10-CM

## 2022-06-21 NOTE — Telephone Encounter (Signed)
Requested medication (s) are due for refill today: yes  Requested medication (s) are on the active medication list: expired  Last refill:  04/27/22  Future visit scheduled: yes  Notes to clinic:  prescription expired 02/25/22   Requested Prescriptions  Pending Prescriptions Disp Refills   amLODipine (NORVASC) 5 MG tablet [Pharmacy Med Name: AMLODIPINE BESYLATE 5MG  TABLETS] 90 tablet     Sig: TAKE 1 TABLET(5 MG) BY MOUTH DAILY     Cardiovascular: Calcium Channel Blockers 2 Failed - 06/19/2022 10:12 AM      Failed - Last BP in normal range    BP Readings from Last 1 Encounters:  04/27/22 (!) 150/78         Passed - Last Heart Rate in normal range    Pulse Readings from Last 1 Encounters:  04/27/22 71         Passed - Valid encounter within last 6 months    Recent Outpatient Visits           1 month ago Primary hypertension   [facility] [place], [facility], PA-C   1 year ago Routine general medical examination at a health care facility   [facility], NP   2 years ago Adult general medical exam   [facility], PA-C   2 years ago Hypothyroidism, unspecified type   [facility] D, MD   3 years ago Chronic migraine without aura without status migrainosus, not intractable   [facility] [place], [facility], NP       Future Appointments             Tomorrow Percell Belt, PA-C [facility], [facility]

## 2022-06-21 NOTE — Telephone Encounter (Signed)
Pt has an appointment with you tomorrow for Medicare 1st visit.

## 2022-06-22 ENCOUNTER — Encounter: Payer: Self-pay | Admitting: Family Medicine

## 2022-06-22 ENCOUNTER — Ambulatory Visit (INDEPENDENT_AMBULATORY_CARE_PROVIDER_SITE_OTHER): Payer: Medicare Other | Admitting: Family Medicine

## 2022-06-22 VITALS — BP 130/74 | HR 85 | Temp 97.6°F | Resp 16 | Ht 65.0 in | Wt 171.7 lb

## 2022-06-22 DIAGNOSIS — Z1159 Encounter for screening for other viral diseases: Secondary | ICD-10-CM

## 2022-06-22 DIAGNOSIS — G43709 Chronic migraine without aura, not intractable, without status migrainosus: Secondary | ICD-10-CM

## 2022-06-22 DIAGNOSIS — Z780 Asymptomatic menopausal state: Secondary | ICD-10-CM | POA: Diagnosis not present

## 2022-06-22 DIAGNOSIS — I10 Essential (primary) hypertension: Secondary | ICD-10-CM

## 2022-06-22 DIAGNOSIS — Z23 Encounter for immunization: Secondary | ICD-10-CM

## 2022-06-22 DIAGNOSIS — K219 Gastro-esophageal reflux disease without esophagitis: Secondary | ICD-10-CM

## 2022-06-22 DIAGNOSIS — Z114 Encounter for screening for human immunodeficiency virus [HIV]: Secondary | ICD-10-CM | POA: Diagnosis not present

## 2022-06-22 DIAGNOSIS — Z1211 Encounter for screening for malignant neoplasm of colon: Secondary | ICD-10-CM

## 2022-06-22 DIAGNOSIS — Z0000 Encounter for general adult medical examination without abnormal findings: Secondary | ICD-10-CM | POA: Diagnosis not present

## 2022-06-22 DIAGNOSIS — E039 Hypothyroidism, unspecified: Secondary | ICD-10-CM

## 2022-06-22 DIAGNOSIS — E785 Hyperlipidemia, unspecified: Secondary | ICD-10-CM

## 2022-06-22 DIAGNOSIS — Z1231 Encounter for screening mammogram for malignant neoplasm of breast: Secondary | ICD-10-CM

## 2022-06-22 DIAGNOSIS — Z789 Other specified health status: Secondary | ICD-10-CM

## 2022-06-22 DIAGNOSIS — Z8249 Family history of ischemic heart disease and other diseases of the circulatory system: Secondary | ICD-10-CM

## 2022-06-22 MED ORDER — LEVOTHYROXINE SODIUM 88 MCG PO TABS: 88.0000 ug | ORAL_TABLET | Freq: Every day | ORAL | 3 refills | Status: AC

## 2022-06-22 MED ORDER — ATORVASTATIN CALCIUM 20 MG PO TABS: 20.0000 mg | ORAL_TABLET | Freq: Every day | ORAL | 3 refills | Status: AC

## 2022-06-22 MED ORDER — SUMATRIPTAN SUCCINATE 100 MG PO TABS: ORAL_TABLET | ORAL | 11 refills | Status: AC

## 2022-06-22 NOTE — Progress Notes (Unsigned)
Subjective:   Dawn Wells is a 66 y.o. female who presents for an Initial Medicare Annual Wellness Visit.  Review of Systems     Cardiac Risk Factors include:  -     Objective:    Today's Vitals   06/22/22 1340  BP: 130/74  Pulse: 85  Resp: 16  Temp: 97.6 F (36.4 C)  TempSrc: Oral  SpO2: 97%  Weight: 171 lb 11.2 oz (77.9 kg)  Height: 5\' 5"  (1.651 m)   Body mass index is 28.57 kg/m.     06/22/2022    1:50 PM 02/28/2017   11:11 AM 02/26/2016    2:09 PM 02/26/2016    2:06 PM  Advanced Directives  Does Patient Have a Medical Advance Directive? Yes No No No  Type of 02/28/2016 of [place];Living will     Does patient want to make changes to medical advance directive? No - Guardian declined     Copy of Healthcare Power of Attorney in Chart? No - copy requested     Would patient like information on creating a medical advance directive?   No - patient declined information No - patient declined information    Current Medications (verified) Outpatient Encounter Medications as of 06/22/2022  Medication Sig   amLODipine (NORVASC) 5 MG tablet TAKE 1 TABLET(5 MG) BY MOUTH DAILY   aspirin-acetaminophen-caffeine (EXCEDRIN MIGRAINE) 250-250-65 MG tablet Take 1 tablet by mouth 2 (two) times daily as needed for headache.   atorvastatin (LIPITOR) 20 MG tablet Take 1 tablet (20 mg total) by mouth at bedtime.   levothyroxine (SYNTHROID) 88 MCG tablet Take 1 tablet (88 mcg total) by mouth daily.   Multiple Vitamins-Minerals (WOMENS 50+ MULTI VITAMIN PO) Take by mouth.   Omeprazole-Sodium Bicarbonate (ZEGERID) 20-1100 MG CAPS capsule Take 1 capsule by mouth daily before breakfast.   SUMAtriptan (IMITREX) 100 MG tablet take 1/2 to 1 tablet by mouth immediately and may repeat after 2 hours MAX DAILY DOSE OF 2.   [DISCONTINUED] amLODipine (NORVASC) 5 MG tablet Take 1 tablet (5 mg total) by mouth daily.   No facility-administered encounter medications on file as of  06/22/2022.    Allergies (verified) Patient has no known allergies.   History: Past Medical History:  Diagnosis Date   Depression    GERD (gastroesophageal reflux disease)    Hyperlipidemia    Migraine    Stage 3a chronic kidney disease (HCC) 03/06/2020   Thyroid disease    Vitamin D deficiency    Past Surgical History:  Procedure Laterality Date   BREAST BIOPSY Left    CESAREAN SECTION     X2   TONSILLECTOMY     Family History  Problem Relation Age of Onset   Leukemia Mother    Hyperlipidemia Mother    Hypertension Mother    Heart disease Father    Hyperlipidemia Father    Hypertension Father    Kidney disease Father    Hypertension Brother    Hyperlipidemia Brother    Heart disease Brother    Diabetes Brother    Scoliosis Brother    Hypertension Son    Heart disease Son 40       fatal MI   Hyperlipidemia Son    Social History   Socioeconomic History   Marital status: Married    Spouse name: Richard   Number of children: 2   Years of education: Not on file   Highest education level: Master's degree (e.g., MA, MS, MEng, MEd, MSW,  MBA)  Occupational History   Occupation: Physiological scientist with [employer]  Tobacco Use   Smoking status: Former    Packs/day: 0.50    Years: 4.00    Total pack years: 2.00    Types: Cigarettes    Quit date: 02/25/1974    Years since quitting: 48.3   Smokeless tobacco: Never  Vaping Use   Vaping Use: Never used  Substance and Sexual Activity   Alcohol use: No    Alcohol/week: 0.0 standard drinks of alcohol   Drug use: No   Sexual activity: Not Currently  Other Topics Concern   Not on file  Social History Narrative   Husband has Dementia      Patient has 3 grandkids (2 boys & 1 girl)   Social Determinants of Health   Financial Resource Strain: Low Risk  (04/23/2021)   Overall Financial Resource Strain (CARDIA)    Difficulty of Paying Living Expenses: Not hard at all  Food Insecurity: No Food Insecurity  (04/23/2021)   Hunger Vital Sign    Worried About Running Out of Food in the Last Year: Never true    Ran Out of Food in the Last Year: Never true  Transportation Needs: No Transportation Needs (04/23/2021)   PRAPARE - Administrator, Civil Service (Medical): No    Lack of Transportation (Non-Medical): No  Physical Activity: Inactive (04/23/2021)   Exercise Vital Sign    Days of Exercise per Week: 0 days    Minutes of Exercise per Session: 0 min  Stress: No Stress Concern Present (04/23/2021)   [employer] of Occupational Health - Occupational Stress Questionnaire    Feeling of Stress : Only a little  Social Connections: Moderately Isolated (04/23/2021)   Social Connection and Isolation Panel [NHANES]    Frequency of Communication with Friends and Family: More than three times a week    Frequency of Social Gatherings with Friends and Family: Once a week    Attends Religious Services: Never    Database administrator or Organizations: No    Attends Engineer, structural: Never    Marital Status: Married    Tobacco Counseling Counseling given: Not Answered   Clinical Intake:  Pre-visit preparation completed: No  Pain : No/denies pain     BMI - recorded: 28 Nutritional Status: BMI 25 -29 Overweight Nutritional Risks: None Diabetes: No  How often do you need to have someone help you when you read instructions, pamphlets, or other written materials from your doctor or pharmacy?: 1 - Never What is the last grade level you completed in school?: Masters Degree  Diabetic?***  Interpreter Needed?: No      Activities of Daily Living    06/22/2022    1:46 PM 04/27/2022    8:26 AM  In your present state of health, do you have any difficulty performing the following activities:  Hearing? 0 0  Vision? 0 0  Difficulty concentrating or making decisions? 0 0  Walking or climbing stairs? 0 0  Dressing or bathing? 0 0  Doing errands, shopping? 0 0   Preparing Food and eating ? N   Using the Toilet? N   In the past six months, have you accidently leaked urine? N   Do you have problems with loss of bowel control? N   Managing your Medications? N   Managing your Finances? N   Housekeeping or managing your Housekeeping? N     Patient Care Team: Angelica Chessman,  Jimmy Footman as PCP - General (Family Medicine)  Indicate any recent Medical Services you may have received from other than [facility] providers in the past year (date may be approximate).     Assessment:   This is a routine wellness examination for Adiya.  Hearing/Vision screen Hearing Screening   500Hz  1000Hz  2000Hz  4000Hz   Right ear Pass Pass Pass Pass  Left ear Pass Pass Pass Pass   Vision Screening   Right eye Left eye Both eyes  Without correction     With correction 20/20 20/25 20/15     Dietary issues and exercise activities discussed: Current Exercise Habits: Home exercise routine, Type of exercise: walking;Other - see comments, Time (Minutes): > 60, Frequency (Times/Week): 7, Weekly Exercise (Minutes/Week): 0, Intensity: Mild, Exercise limited by: None identified   Goals Addressed   None    Depression Screen    06/22/2022    1:45 PM 04/27/2022    8:26 AM 04/23/2021    8:28 AM 04/07/2020   10:26 AM 04/07/2020   10:25 AM 03/06/2020   10:03 AM 03/07/2019    2:27 PM  PHQ 2/9 Scores  PHQ - 2 Score 0 0 0 0 0 0 0  PHQ- 9 Score 0 0 0 0 0 0     Fall Risk    06/22/2022    1:45 PM 04/27/2022    8:26 AM 04/23/2021    8:28 AM 04/07/2020   10:25 AM 03/06/2020   10:03 AM  Fall Risk   Falls in the past year? 0 0 0 0 0  Number falls in past yr: 0 0 0 0 0  Injury with Fall? 0 0 0 0 0  Risk for fall due to : No Fall Risks No Fall Risks     Follow up Falls prevention discussed;Education provided Falls prevention discussed;Education provided Falls evaluation completed  Falls evaluation completed    FALL RISK PREVENTION PERTAINING TO THE HOME:  Any stairs in or around the home?  {YES/NO:21197} If so, are there any without handrails? {YES/NO:21197} Home free of loose throw rugs in walkways, pet beds, electrical cords, etc? {YES/NO:21197} Adequate lighting in your home to reduce risk of falls? {YES/NO:21197}  ASSISTIVE DEVICES UTILIZED TO PREVENT FALLS:  Life alert? {YES/NO:21197} Use of a cane, walker or w/c? {YES/NO:21197} Grab bars in the bathroom? {YES/NO:21197} Shower chair or bench in shower? {YES/NO:21197} Elevated toilet seat or a handicapped toilet? {YES/NO:21197}  TIMED UP AND GO:  Was the test performed? {YES/NO:21197}.  Length of time to ambulate 10 feet: *** sec.   {Appearance of 06/24/2022  Cognitive Function:    06/22/2022    1:52 PM  MMSE - Mini Mental State Exam  Orientation to time 5  Orientation to Place 5  Registration 3  Attention/ Calculation 5  Recall 3  Language- name 2 objects 2  Language- repeat 1  Language- follow 3 step command 3  Language- read & follow direction 1  Write a sentence 1  Copy design 1  Total score 30        Immunizations Immunization History  Administered Date(s) Administered   Influenza,inj,Quad PF,6+ Mos 08/03/2017, 08/14/2018, 08/09/2019   Influenza-Unspecified 07/25/2016   PFIZER(Purple Top)SARS-COV-2 Vaccination 01/03/2020, 01/24/2020, 10/09/2020    {TDAP status:2101805}  {Flu Vaccine status:2101806}  {Pneumococcal vaccine status:2101807}  {Covid-19 vaccine status:2101808}  Qualifies for Shingles Vaccine? {YES/NO:21197}  Zostavax completed {YES/NO:21197}  {Shingrix Completed?:2101804}  Screening Tests Health Maintenance  Topic Date Due   Hepatitis C Screening  Never done   COLONOSCOPY (  Pts 45-68yrs Insurance coverage will need to be confirmed)  Never done   MAMMOGRAM  03/17/2016   COVID-19 Vaccine (4 - Pfizer series) 12/04/2020   DEXA SCAN  Never done   INFLUENZA VACCINE  06/01/2022   Zoster Vaccines- Shingrix (1 of 2) 07/28/2022 (Originally 09/21/2006)   Pneumonia Vaccine  48+ Years old (1 - PCV) 04/28/2023 (Originally 09/21/2021)   PAP SMEAR-Modifier  04/08/2023   TETANUS/TDAP  09/01/2024   HIV Screening  Completed   HPV VACCINES  Aged Out    Health Maintenance  Health Maintenance Due  Topic Date Due   Hepatitis C Screening  Never done   COLONOSCOPY (Pts 45-45yrs Insurance coverage will need to be confirmed)  Never done   MAMMOGRAM  03/17/2016   COVID-19 Vaccine (4 - Pfizer series) 12/04/2020   DEXA SCAN  Never done   INFLUENZA VACCINE  06/01/2022    {Colorectal cancer screening:2101809}  {Mammogram status:21018020}  {Bone Density status:21018021}  Lung Cancer Screening: (Low Dose CT Chest recommended if Age 50-80 years, 30 pack-year currently smoking OR have quit w/in 15years.) {DOES NOT does:27190::"does not"} qualify.   Lung Cancer Screening Referral: ***  Additional Screening:  Hepatitis C Screening: {DOES NOT does:27190::"does not"} qualify; Completed ***  Vision Screening: Recommended annual ophthalmology exams for early detection of glaucoma and other disorders of the eye. Is the patient up to date with their annual eye exam?  {YES/NO:21197} Who is the provider or what is the name of the office in which the patient attends annual eye exams? *** If pt is not established with a provider, would they like to be referred to a provider to establish care? {YES/NO:21197}.   Dental Screening: Recommended annual dental exams for proper oral hygiene  Community Resource Referral / Chronic Care Management: CRR required this visit?  {YES/NO:21197}  CCM required this visit?  {YES/NO:21197}     Plan:     I have personally reviewed and noted the following in the patient's chart:   Medical and social history Use of alcohol, tobacco or illicit drugs  Current medications and supplements including opioid prescriptions. {Opioid Prescriptions:[phone]} Functional ability and status Nutritional status Physical activity Advanced  directives List of other physicians Hospitalizations, surgeries, and ER visits in previous 12 months Vitals Screenings to include cognitive, depression, and falls Referrals and appointments  In addition, I have reviewed and discussed with patient certain preventive protocols, quality metrics, and best practice recommendations. A written personalized care plan for preventive services as well as general preventive health recommendations were provided to patient.     Danelle Berry, PA-C   06/22/2022   Nurse Notes: ***

## 2022-06-22 NOTE — Patient Instructions (Signed)
  Dawn Wells , Thank you for taking time to come for your Medicare Wellness Visit. I appreciate your ongoing commitment to your health goals. Please review the following plan we discussed and let me know if I can assist you in the future.   These are the goals we discussed:  Goals   None     This is a list of the screening recommended for you and due dates:  Health Maintenance  Topic Date Due   HIV Screening  Never done   Hepatitis C Screening: USPSTF Recommendation to screen - Ages 9-79 yo.  Never done   Colon Cancer Screening  Never done   Mammogram  03/17/2016   COVID-19 Vaccine (4 - Pfizer series) 12/04/2020   DEXA scan (bone density measurement)  Never done   Flu Shot  06/01/2022   Zoster (Shingles) Vaccine (1 of 2) 07/28/2022*   Pneumonia Vaccine (1 - PCV) 04/28/2023*   Pap Smear  04/08/2023   Tetanus Vaccine  09/01/2024   HPV Vaccine  Aged Out  *Topic was postponed. The date shown is not the original due date.     Please see the additional handouts from Medicare/CMS for resources to know what is covered under the well visits and screenings.

## 2022-06-22 NOTE — Progress Notes (Unsigned)
Subjective:   Dawn Wells is a 66 y.o. female who presents for an Initial Medicare Annual Wellness Visit.  Review of Systems    Review of Systems  Constitutional: Negative.  Negative for activity change, appetite change, fatigue and unexpected weight change.  HENT: Negative.    Eyes: Negative.   Respiratory: Negative.  Negative for shortness of breath.   Cardiovascular: Negative.  Negative for chest pain, palpitations and leg swelling.  Gastrointestinal: Negative.  Negative for abdominal pain and blood in stool.  Endocrine: Negative.   Genitourinary: Negative.   Musculoskeletal: Negative.  Negative for arthralgias, gait problem, joint swelling and myalgias.  Skin: Negative.  Negative for pallor and rash.  Allergic/Immunologic: Negative.   Neurological: Negative.  Negative for syncope and weakness.  Hematological: Negative.   Psychiatric/Behavioral: Negative.  Negative for dysphoric mood, self-injury and suicidal ideas. The patient is not nervous/anxious.   All other systems reviewed and are negative.    Cardiac Risk Factors include: age, HTN, HLD     Objective:    Today's Vitals   06/22/22 1340  BP: 130/74  Pulse: 85  Resp: 16  Temp: 97.6 F (36.4 C)  TempSrc: Oral  SpO2: 97%  Weight: 171 lb 11.2 oz (77.9 kg)  Height:  (1.651 m)   Body mass index is 28.57 kg/m.  Physical Exam Vitals and nursing note reviewed.  Constitutional:      General: She is not in acute distress.    Appearance: Normal appearance. She is well-developed and overweight. She is not ill-appearing, toxic-appearing or diaphoretic.  HENT:     Head: Normocephalic and atraumatic.     Nose: Nose normal.  Eyes:     General:        Right eye: No discharge.        Left eye: No discharge.     Conjunctiva/sclera: Conjunctivae normal.  Neck:     Trachea: No tracheal deviation.  Cardiovascular:     Rate and Rhythm: Normal rate and regular rhythm.  Pulmonary:     Effort: Pulmonary effort is  normal. No respiratory distress.     Breath sounds: Normal breath sounds. No stridor.  Musculoskeletal:        General: Normal range of motion.  Skin:    General: Skin is warm and dry.     Findings: No rash.  Neurological:     Mental Status: She is alert. Mental status is at baseline.     Motor: No abnormal muscle tone.     Coordination: Coordination normal.     Gait: Gait normal.  Psychiatric:        Mood and Affect: Mood normal.        Behavior: Behavior normal. Behavior is cooperative.         06/22/2022    1:50 PM 02/28/2017   11:11 AM 02/26/2016    2:09 PM 02/26/2016    2:06 PM  Advanced Directives  Does Patient Have a Medical Advance Directive? Yes No No No  Type of Estate agent of [place];Living will     Does patient want to make changes to medical advance directive? No - Guardian declined     Copy of Healthcare Power of Attorney in Chart? No - copy requested     Would patient like information on creating a medical advance directive?   No - patient declined information No - patient declined information    Current Medications (verified) Outpatient Encounter Medications as of 06/22/2022  Medication Sig  amLODipine (NORVASC) 5 MG tablet TAKE 1 TABLET(5 MG) BY MOUTH DAILY   aspirin-acetaminophen-caffeine (EXCEDRIN MIGRAINE) 250-250-65 MG tablet Take 1 tablet by mouth 2 (two) times daily as needed for headache.   Multiple Vitamins-Minerals (WOMENS 50+ MULTI VITAMIN PO) Take by mouth.   Omeprazole-Sodium Bicarbonate (ZEGERID) 20-1100 MG CAPS capsule Take 1 capsule by mouth daily before breakfast.   [DISCONTINUED] atorvastatin (LIPITOR) 20 MG tablet Take 1 tablet (20 mg total) by mouth at bedtime.   [DISCONTINUED] levothyroxine (SYNTHROID) 88 MCG tablet Take 1 tablet (88 mcg total) by mouth daily.   [DISCONTINUED] SUMAtriptan (IMITREX) 100 MG tablet take 1/2 to 1 tablet by mouth immediately and may repeat after 2 hours MAX DAILY DOSE OF 2.   atorvastatin  (LIPITOR) 20 MG tablet Take 1 tablet (20 mg total) by mouth at bedtime.   levothyroxine (SYNTHROID) 88 MCG tablet Take 1 tablet (88 mcg total) by mouth daily.   SUMAtriptan (IMITREX) 100 MG tablet take 1/2 to 1 tablet by mouth immediately and may repeat after 2 hours MAX DAILY DOSE OF 2.   [DISCONTINUED] amLODipine (NORVASC) 5 MG tablet Take 1 tablet (5 mg total) by mouth daily.   No facility-administered encounter medications on file as of 06/22/2022.    Allergies (verified) Patient has no known allergies.   History: Past Medical History:  Diagnosis Date   Depression    GERD (gastroesophageal reflux disease)    Hyperlipidemia    Migraine    Stage 3a chronic kidney disease (HCC) 03/06/2020   Thyroid disease    Vitamin D deficiency    Past Surgical History:  Procedure Laterality Date   BREAST BIOPSY Left    CESAREAN SECTION     X2   TONSILLECTOMY     Family History  Problem Relation Age of Onset   Leukemia Mother    Hyperlipidemia Mother    Hypertension Mother    Heart disease Father    Hyperlipidemia Father    Hypertension Father    Kidney disease Father    Hypertension Brother    Hyperlipidemia Brother    Heart disease Brother    Diabetes Brother    Scoliosis Brother    Hypertension Son    Heart disease Son 40       fatal MI   Hyperlipidemia Son    Social History   Socioeconomic History   Marital status: Married    Spouse name: Richard   Number of children: 2   Years of education: Not on file   Highest education level: Master's degree (e.g., MA, MS, MEng, MEd, MSW, MBA)  Occupational History   Occupation: Physiological scientist with [employer]  Tobacco Use   Smoking status: Former    Packs/day: 0.50    Years: 4.00    Total pack years: 2.00    Types: Cigarettes    Quit date: 02/25/1974    Years since quitting: 48.3   Smokeless tobacco: Never  Vaping Use   Vaping Use: Never used  Substance and Sexual Activity   Alcohol use: No    Alcohol/week:  0.0 standard drinks of alcohol   Drug use: No   Sexual activity: Not Currently  Other Topics Concern   Not on file  Social History Narrative   Husband has Dementia      Patient has 3 grandkids (2 boys & 1 girl)   Social Determinants of Health   Financial Resource Strain: Low Risk  (04/23/2021)   Overall Financial Resource Strain (CARDIA)  Difficulty of Paying Living Expenses: Not hard at all  Food Insecurity: No Food Insecurity (04/23/2021)   Hunger Vital Sign    Worried About Running Out of Food in the Last Year: Never true    Ran Out of Food in the Last Year: Never true  Transportation Needs: No Transportation Needs (04/23/2021)   PRAPARE - Administrator, Civil Service (Medical): No    Lack of Transportation (Non-Medical): No  Physical Activity: Inactive (04/23/2021)   Exercise Vital Sign    Days of Exercise per Week: 0 days    Minutes of Exercise per Session: 0 min  Stress: No Stress Concern Present (04/23/2021)   [employer] of Occupational Health - Occupational Stress Questionnaire    Feeling of Stress : Only a little  Social Connections: Moderately Isolated (04/23/2021)   Social Connection and Isolation Panel [NHANES]    Frequency of Communication with Friends and Family: More than three times a week    Frequency of Social Gatherings with Friends and Family: Once a week    Attends Religious Services: Never    Database administrator or Organizations: No    Attends Engineer, structural: Never    Marital Status: Married    Tobacco Counseling Counseling given: Not Answered   Clinical Intake:  Pre-visit preparation completed: No  Pain : No/denies pain     BMI - recorded: 28 Nutritional Status: BMI 25 -29 Overweight Nutritional Risks: None Diabetes: No  How often do you need to have someone help you when you read instructions, pamphlets, or other written materials from your doctor or pharmacy?: 1 - Never What is the last grade level you  completed in school?: Masters Degree  Interpreter Needed?: No      Activities of Daily Living    06/22/2022    1:46 PM 04/27/2022    8:26 AM  In your present state of health, do you have any difficulty performing the following activities:  Hearing? 0 0  Vision? 0 0  Difficulty concentrating or making decisions? 0 0  Walking or climbing stairs? 0 0  Dressing or bathing? 0 0  Doing errands, shopping? 0 0  Preparing Food and eating ? N   Using the Toilet? N   In the past six months, have you accidently leaked urine? N   Do you have problems with loss of bowel control? N   Managing your Medications? N   Managing your Finances? N   Housekeeping or managing your Housekeeping? N      Immunizations and Health Maintenance Immunization History  Administered Date(s) Administered   Influenza,inj,Quad PF,6+ Mos 08/03/2017, 08/14/2018, 08/09/2019   Influenza-Unspecified 07/25/2016   PFIZER(Purple Top)SARS-COV-2 Vaccination 01/03/2020, 01/24/2020, 10/09/2020   Health Maintenance Due  Topic Date Due   Hepatitis C Screening  Never done   COLONOSCOPY (Pts 45-52yrs Insurance coverage will need to be confirmed)  Never done   MAMMOGRAM  03/17/2016   COVID-19 Vaccine (4 - Pfizer series) 12/04/2020   DEXA SCAN  Never done   INFLUENZA VACCINE  06/01/2022    Patient Care Team: Danelle Berry, PA-C as PCP - General (Family Medicine)  Indicate any recent Medical Services you may have received from other than [facility] providers in the past year (date may be approximate). none    Assessment:   This is a routine wellness examination for [place].  Hearing/Vision screen Hearing Screening   500Hz  1000Hz  2000Hz  4000Hz   Right ear Pass Pass Pass Pass  Left ear Pass  Pass Pass Pass   Vision Screening   Right eye Left eye Both eyes  Without correction     With correction 20/20 20/25 20/15     Dietary issues and exercise activities discussed: Current Exercise Habits: Home exercise routine, Type of  exercise: walking;Other - see comments, Time (Minutes): > 60, Frequency (Times/Week): 7, Weekly Exercise (Minutes/Week): 0, Intensity: Mild, Exercise limited by: None identified   Goals Addressed   She declined to make goals    Depression Screen    06/22/2022    1:45 PM 04/27/2022    8:26 AM 04/23/2021    8:28 AM 04/07/2020   10:26 AM 04/07/2020   10:25 AM 03/06/2020   10:03 AM 03/07/2019    2:27 PM  PHQ 2/9 Scores  PHQ - 2 Score 0 0 0 0 0 0 0  PHQ- 9 Score 0 0 0 0 0 0     Fall Risk    06/22/2022    1:45 PM 04/27/2022    8:26 AM 04/23/2021    8:28 AM 04/07/2020   10:25 AM 03/06/2020   10:03 AM  Fall Risk   Falls in the past year? 0 0 0 0 0  Number falls in past yr: 0 0 0 0 0  Injury with Fall? 0 0 0 0 0  Risk for fall due to : No Fall Risks No Fall Risks     Follow up Falls prevention discussed;Education provided Falls prevention discussed;Education provided Falls evaluation completed  Falls evaluation completed   FALL RISK PREVENTION PERTAINING TO THE HOME:  Any stairs in or around the home? Yes  If so, are there any without handrails? No   Home free of loose throw rugs in walkways, pet beds, electrical cords, etc? Yes  Adequate lighting in your home to reduce risk of falls? Yes   ASSISTIVE DEVICES UTILIZED TO PREVENT FALLS:  Life alert? No  Use of a cane, walker or w/c? No  Grab bars in the bathroom? Yes  Shower chair or bench in shower? No  Elevated toilet seat or a handicapped toilet? No    DME ORDERS:  DME order needed?  No   TIMED UP AND GO:  Was the test performed? Yes .  Length of time to ambulate 10 feet: 15 sec.   GAIT:  Appearance of gait: Gait steady and fast without the use of an assistive device. OR Gait slow and steady with/without the use of an assistive device.  Education: Fall risk prevention has been discussed.  Intervention(s) required? No   DME/home health order needed?  No     Cognitive Function:    06/22/2022    1:52 PM  MMSE - Mini Mental  State Exam  Orientation to time 5  Orientation to Place 5  Registration 3  Attention/ Calculation 5  Recall 3  Language- name 2 objects 2  Language- repeat 1  Language- follow 3 step command 3  Language- read & follow direction 1  Write a sentence 1  Copy design 1  Total score 30        Screening Tests Health Maintenance  Topic Date Due   Hepatitis C Screening  Never done   COLONOSCOPY (Pts 45-3yrs Insurance coverage will need to be confirmed)  Never done   MAMMOGRAM  03/17/2016   COVID-19 Vaccine (4 - Pfizer series) 12/04/2020   DEXA SCAN  Never done   INFLUENZA VACCINE  06/01/2022   Zoster Vaccines- Shingrix (1 of 2) 07/28/2022 (Originally 09/21/2006)  Pneumonia Vaccine 57+ Years old (1 - PCV) 04/28/2023 (Originally 09/21/2021)   PAP SMEAR-Modifier  04/08/2023   TETANUS/TDAP  09/01/2024   HIV Screening  Completed   HPV VACCINES  Aged Out    Qualifies for Shingles Vaccine? Yes  Pt refused. Due for Shingrix. Education has been provided regarding the importance of this vaccine. Pt has been advised to call insurance company to determine out of pocket expense. Advised may also receive vaccine at local pharmacy or Health Dept. Verbalized acceptance and understanding.  Tdap: Although this vaccine is not a covered service during a Wellness Exam, does the patient still wish to receive this vaccine today?   Pt still not due until 09/01/2024 .  Education has been provided regarding the importance of this vaccine. Advised may receive this vaccine at local pharmacy or Health Dept. Aware to provide a copy of the vaccination record if obtained from local pharmacy or Health Dept. Verbalized acceptance and understanding.  Flu Vaccine: Due for Flu vaccine. Does the patient want to receive this vaccine today?   No but will wait till later in October, 2023 . Education has been provided regarding the importance of this vaccine but still declined. Advised may receive this vaccine at local  pharmacy or Health Dept. Aware to provide a copy of the vaccination record if obtained from local pharmacy or Health Dept. Verbalized acceptance and understanding.  Pneumococcal Vaccine: Due for Pneumococcal vaccine. Does the patient want to receive this vaccine today?  No . Education has been provided regarding the importance of this vaccine but still declined. Advised may receive this vaccine at local pharmacy or Health Dept. Aware to provide a copy of the vaccination record if obtained from local pharmacy or Health Dept. Verbalized acceptance and understanding.   Covid-19 Vaccine: up to date Information provided  Cancer Screenings:  Colorectal Screening: Refused. Repeat every 10 years; No longer required. Referral to GI was refused.   Mammogram: Last Completed 03/18/2015. Repeat every year; Pt refused. Very poor/frustrating experience previously with high bill after - refuses - offered other locations for mammography Pt provided with contact info and advised to call to schedule appt. Pt aware the office will call re: appt.  Bone Density: pt refused. Results reflect NORMAL, OSTEOPENIA, OSTEOPOROSIS.  Pt provided with contact info and advised to call to schedule appt. Pt aware the office will call re: appt.  Lung Cancer Screening: (Low Dose CT Chest recommended if Age 80-80 years, 30 pack-year currently smoking OR have quit w/in 15years.) does not qualify.   Lung Cancer Screening Referral: n/a  Additional Screening:  Hepatitis C Screening: DUE; pt refused  Vision Screening: Recommended annual ophthalmology exams for early detection of glaucoma and other disorders of the eye. Is the patient up to date with their annual eye exam?  Yes  Who is the provider or what is the name of the office in which the pt attends annual eye exams?  If pt is not established with a provider, would they like to be referred to a provider to establish care? No . Ophthalmology referral has been placed. Pt aware the  office will call re: appt.  Dental Screening: Recommended annual dental exams for proper oral hygiene  Community Resource Referral:  CRR required this visit?  No       Plan:  I have personally reviewed and addressed the Medicare Annual Wellness questionnaire and have noted the following in the patient's chart:  A. Medical and social history B. Use of alcohol, tobacco  or illicit drugs  C. Current medications and supplements D. Functional ability and status E.  Nutritional status F.  Physical activity G. Advance directives H. List of other physicians I.  Hospitalizations, surgeries, and ER visits in previous 12 months J.  Vitals K. Screenings such as hearing and vision if needed, cognitive and depression L. Referrals and appointments   In addition, I have reviewed and discussed with patient certain preventive protocols, quality metrics, and best practice recommendations. A written personalized care plan for preventive services as well as general preventive health recommendations were provided to patient.  Lengthy discussion with pt about preventative medicine, vaccines, screening tests and our job as PCP  Encouraged her to reach out whenever she wants to pursue any f/up or screenings. At this time she does not want to   Problem List Items Addressed This Visit       Cardiovascular and Mediastinum   Chronic migraine without aura without status migrainosus, not intractable    stable, minimal sx      Relevant Medications   SUMAtriptan (IMITREX) 100 MG tablet   atorvastatin (LIPITOR) 20 MG tablet   Primary hypertension    BP at goal today, continue norvasc, no labs needed, reviewed recent OV and labs      Relevant Medications   atorvastatin (LIPITOR) 20 MG tablet     Digestive   Gastroesophageal reflux disease without esophagitis    Daily sx well controlled with OTC ppi, encouraged GI consult, she declined Currently no dysphagia      Relevant Medications    Omeprazole-Sodium Bicarbonate (ZEGERID) 20-1100 MG CAPS capsule     Endocrine   Hypothyroidism (Chronic)    Lab Results  Component Value Date   TSH 1.61 04/27/2022  Continue same dose F/up at least annually for monitoring labs and refills      Relevant Medications   levothyroxine (SYNTHROID) 88 MCG tablet     Other   Dyslipidemia (Chronic)    On statin Lab Results  Component Value Date   CHOL 175 04/27/2022   HDL 42 (L) 04/27/2022   LDLCALC 101 (H) 04/27/2022   TRIG 203 (H) 04/27/2022   CHOLHDL 4.2 04/27/2022   Reviewed readings, may want to consider increasing dose with gradual rise in LDL and sig family hx       Relevant Medications   atorvastatin (LIPITOR) 20 MG tablet   Other Visit Diagnoses     Medicare annual wellness visit, initial    -  Primary   Relevant Orders   Hearing screening   Visual acuity screening   Screening for HIV without presence of risk factors       refused   Encounter for hepatitis C screening test for low risk patient       refused   Postmenopausal estrogen deficiency       advised screening dexa   Encounter for screening mammogram for malignant neoplasm of breast       ordered, but unlikely to complete   Screening for malignant neoplasm of colon       strongly encouraged GI consult - offered FIT test and cologuard - declined   Need for influenza vaccination       refused   Need for pneumococcal vaccination       refused   Need for shingles vaccine       refused   Family history of cardiac disorder       encouraged pt to optimize LDL, cardiology consult for her own  risk assessment is available and offered, declined, no current exertional sx        Signed,    Danelle BerryLeisa Tapia, PA-C   06/22/2022  Nurse Health Advisor

## 2022-06-22 NOTE — Progress Notes (Deleted)
Subjective:    Dawn Wells is a 66 y.o. female who presents for Medicare Initial preventive examination.  Preventive Screening-Counseling & Management  Tobacco Social History   Tobacco Use  Smoking Status Former   Types: Cigarettes   Quit date: 02/25/1974   Years since quitting: 48.3  Smokeless Tobacco Never     Problems Prior to Visit 1.   Current Problems (verified) Patient Active Problem List   Diagnosis Date Noted   Primary hypertension 04/27/2022   Stage 3a chronic kidney disease (HCC) 03/06/2020   Chronic migraine without aura without status migrainosus, not intractable 03/06/2020   Caregiver stress 03/21/2016   Preventative health care 02/26/2016   Dyslipidemia 06/12/2015   Gastroesophageal reflux disease without esophagitis 06/12/2015   Headache, migraine 06/12/2015   Hypothyroidism 06/12/2015    Medications Prior to Visit Current Outpatient Medications on File Prior to Visit  Medication Sig Dispense Refill   amLODipine (NORVASC) 5 MG tablet TAKE 1 TABLET(5 MG) BY MOUTH DAILY 90 tablet 1   aspirin-acetaminophen-caffeine (EXCEDRIN MIGRAINE) 250-250-65 MG tablet Take 1 tablet by mouth 2 (two) times daily as needed for headache.     atorvastatin (LIPITOR) 20 MG tablet Take 1 tablet (20 mg total) by mouth at bedtime. 90 tablet 0   levothyroxine (SYNTHROID) 88 MCG tablet Take 1 tablet (88 mcg total) by mouth daily. 90 tablet 0   Multiple Vitamins-Minerals (WOMENS 50+ MULTI VITAMIN PO) Take by mouth.     SUMAtriptan (IMITREX) 100 MG tablet take 1/2 to 1 tablet by mouth immediately and may repeat after 2 hours MAX DAILY DOSE OF 2. 9 tablet 0   No current facility-administered medications on file prior to visit.    Current Medications (verified) Current Outpatient Medications  Medication Sig Dispense Refill   amLODipine (NORVASC) 5 MG tablet TAKE 1 TABLET(5 MG) BY MOUTH DAILY 90 tablet 1   aspirin-acetaminophen-caffeine (EXCEDRIN MIGRAINE) 250-250-65 MG tablet  Take 1 tablet by mouth 2 (two) times daily as needed for headache.     atorvastatin (LIPITOR) 20 MG tablet Take 1 tablet (20 mg total) by mouth at bedtime. 90 tablet 0   levothyroxine (SYNTHROID) 88 MCG tablet Take 1 tablet (88 mcg total) by mouth daily. 90 tablet 0   Multiple Vitamins-Minerals (WOMENS 50+ MULTI VITAMIN PO) Take by mouth.     SUMAtriptan (IMITREX) 100 MG tablet take 1/2 to 1 tablet by mouth immediately and may repeat after 2 hours MAX DAILY DOSE OF 2. 9 tablet 0   No current facility-administered medications for this visit.     Allergies (verified) Patient has no known allergies.   PAST HISTORY  Family History Family History  Problem Relation Age of Onset   Leukemia Mother    Hyperlipidemia Mother    Hypertension Mother    Heart disease Father    Hyperlipidemia Father    Hypertension Father    Kidney disease Father    Diabetes Sister    Heart disease Sister    Hyperlipidemia Sister    Hypertension Sister     Social History Social History   Tobacco Use   Smoking status: Former    Types: Cigarettes    Quit date: 02/25/1974    Years since quitting: 48.3   Smokeless tobacco: Never  Substance Use Topics   Alcohol use: No    Alcohol/week: 0.0 standard drinks of alcohol     Are there smokers in your home (other than you)? {yes/no:20286}  Risk Factors Current exercise habits: {exercise:19826}  Dietary issues discussed: ***  Cardiac risk factors: {risk factors:510}.  Depression Screen (Note: if answer to either of the following is "Yes", a more complete depression screening is indicated)   Over the past 2 weeks, have you felt down, depressed or hopeless? {yes/no:20286}  Over the past 2 weeks, have you felt little interest or pleasure in doing things? {yes/no:20286}  Have you lost interest or pleasure in daily life? {yes/no:20286}  Do you often feel hopeless? {yes/no:20286}  Do you cry easily over simple problems? {yes/no:20286}  Activities of Daily  Living In your present state of health, do you have any difficulty performing the following activities?:  Driving? {yes/no:20286} Managing money?  {Responses; yes/no (default no):140031} Feeding yourself? {yes/no:20286} Getting from bed to chair? {yes/no:20286}{exam, Complete:18323} Climbing a flight of stairs? {yes/no:20286} Preparing food and eating?: {yes/no (default no):140031} Bathing or showering? {Responses; yes/no (default no):140031} Getting dressed: {yes/no (default no):140031} Getting to the toilet? {Responses; yes/no (default no):140031} Using the toilet:{yes/no (default no):140031} Moving around from place to place: {yes/no (default no):140031} In the past year have you fallen or had a near fall?:{yes/no (default no):140031}   Are you sexually active?  {yes/no:20286}  Do you have more than one partner?  {Responses; yes/no (default no):140031}  Hearing Difficulties: {yes/no (default no):140031} Do you often ask people to speak up or repeat themselves? {Responses; yes/no (default no):140031} Do you experience ringing or noises in your ears? {Responses; yes/no (default no):140031} Do you have difficulty understanding soft or whispered voices? {Responses; yes/no (default no):140031}   Do you feel that you have a problem with memory? {yes/no:20286}  Do you often misplace items? {yes/no:20286}  Do you feel safe at home?  {yes/no:20286}  Cognitive Testing  Alert? {yes/no:20286}  Normal Appearance?{yes/no:20286}  Oriented to person? {yes/no:20286}  Place? {yes/no:20286}   Time? {yes/no:20286}  Recall of three objects?  {yes/no:20286}  Can perform simple calculations? {yes/no:20286}  Displays appropriate judgment?{yes/no:20286}  Can read the correct time from a watch face?{yes/no:20286}   Advanced Directives have been discussed with the patient? {yes/no:20286}  List the Names of Other Physician/Practitioners you currently use: 1.    Indicate any recent Medical Services  you may have received from other than [facility] providers in the past year (date may be approximate).  Immunization History  Administered Date(s) Administered   Influenza,inj,Quad PF,6+ Mos 08/03/2017, 08/14/2018, 08/09/2019   Influenza-Unspecified 07/25/2016   PFIZER(Purple Top)SARS-COV-2 Vaccination 01/03/2020, 01/24/2020, 10/09/2020    Screening Tests Health Maintenance  Topic Date Due   HIV Screening  Never done   Hepatitis C Screening  Never done   COLONOSCOPY (Pts 45-22yrs Insurance coverage will need to be confirmed)  Never done   MAMMOGRAM  03/17/2016   COVID-19 Vaccine (4 - Pfizer series) 12/04/2020   DEXA SCAN  Never done   INFLUENZA VACCINE  06/01/2022   Zoster Vaccines- Shingrix (1 of 2) 07/28/2022 (Originally 09/21/2006)   Pneumonia Vaccine 73+ Years old (1 - PCV) 04/28/2023 (Originally 09/21/2021)   PAP SMEAR-Modifier  04/08/2023   TETANUS/TDAP  09/01/2024   HPV VACCINES  Aged Out    All answers were reviewed with the patient and necessary referrals were made:  Danelle Berry, PA-C   06/22/2022   History reviewed: {history reviewed:20406::"allergies","current medications","past family history","past medical history","past social history","past surgical history","problem list"}  Review of Systems {ros; complete:30496}    Objective:     Vision by Snellen chart: right eye:{vision:19455::"20/20"}, left eye:{vision:19455::"20/20"}  Body mass index is 28.57 kg/m. BP 130/74   Pulse 85   Temp 97.6 F (36.4 C) (Oral)   Resp  16   Ht 5\' 5"  (1.651 m)   Wt 171 lb 11.2 oz (77.9 kg)   SpO2 97%   BMI 28.57 kg/m   {Exam, female:18323}     Assessment:     ***     Plan:     During the course of the visit the patient was educated and counseled about appropriate screening and preventive services including:   {plan:19836}  Diet review for nutrition referral? Yes ____  Not Indicated ____   Patient Instructions (the written plan) was given to the  patient.  Medicare Attestation I have personally reviewed: The patient's medical and social history Their use of alcohol, tobacco or illicit drugs Their current medications and supplements The patient's functional ability including ADLs,fall risks, home safety risks, cognitive, and hearing and visual impairment Diet and physical activities Evidence for depression or mood disorders  The patient's weight, height, BMI, and visual acuity have been recorded in the chart.  I have made referrals, counseling, and provided education to the patient based on review of the above and I have provided the patient with a written personalized care plan for preventive services.     , PA-C   06/22/2022

## 2022-06-24 ENCOUNTER — Encounter: Payer: Self-pay | Admitting: Family Medicine

## 2022-06-24 NOTE — Assessment & Plan Note (Signed)
Daily sx well controlled with OTC ppi, encouraged GI consult, she declined Currently no dysphagia

## 2022-06-24 NOTE — Assessment & Plan Note (Signed)
On statin Lab Results  Component Value Date   CHOL 175 04/27/2022   HDL 42 (L) 04/27/2022   LDLCALC 101 (H) 04/27/2022   TRIG 203 (H) 04/27/2022   CHOLHDL 4.2 04/27/2022   Reviewed readings, may want to consider increasing dose with gradual rise in LDL and sig family hx

## 2022-06-24 NOTE — Assessment & Plan Note (Signed)
stable, minimal sx

## 2022-06-24 NOTE — Assessment & Plan Note (Signed)
BP at goal today, continue norvasc, no labs needed, reviewed recent OV and labs

## 2022-06-24 NOTE — Assessment & Plan Note (Signed)
Lab Results  Component Value Date   TSH 1.61 04/27/2022   Continue same dose F/up at least annually for monitoring labs and refills

## 2022-10-04 ENCOUNTER — Telehealth: Payer: Self-pay | Admitting: Family Medicine

## 2022-10-04 NOTE — Telephone Encounter (Signed)
Medication Refill - Medication: FLUoxetine (PROZAC) 20 MG capsule   Has the patient contacted their pharmacy? Yes.   Pharmacy said they sent request with no response  Preferred Pharmacy (with phone number or street name):  [id number] [place], [place] - [address] AT [facility] OF [address] [facility] Phone: [phone]  Fax: [phone]     Has the patient been seen for an appointment in the last year OR does the patient have an upcoming appointment? Yes.    Agent: Please be advised that RX refills may take up to 3 business days. We ask that you follow-up with your pharmacy.

## 2022-10-04 NOTE — Telephone Encounter (Signed)
Fluoxetine is not on current list, routing for review.

## 2022-10-04 NOTE — Telephone Encounter (Signed)
It was discontinued.

## 2022-10-05 NOTE — Telephone Encounter (Signed)
Lvm for pt to call and schedule an appt  

## 2022-12-19 ENCOUNTER — Encounter: Payer: Self-pay | Admitting: Family Medicine

## 2022-12-20 ENCOUNTER — Other Ambulatory Visit: Payer: Self-pay | Admitting: Family Medicine

## 2022-12-20 DIAGNOSIS — I10 Essential (primary) hypertension: Secondary | ICD-10-CM

## 2022-12-20 MED ORDER — AMLODIPINE BESYLATE 5 MG PO TABS: ORAL_TABLET | ORAL | 1 refills | Status: AC

## 2023-04-12 ENCOUNTER — Ambulatory Visit: Payer: Medicare Other | Admitting: Family Medicine
